# Patient Record
Sex: Female | Born: 1990 | Race: White | Hispanic: Yes | Marital: Single | Smoking: Current every day smoker
Health system: Southern US, Community
[De-identification: ages and names within clinical notes are randomized; demographics above are authoritative.]

## PROBLEM LIST (undated history)

## (undated) ENCOUNTER — Inpatient Hospital Stay: Payer: Self-pay

## (undated) DIAGNOSIS — E7800 Pure hypercholesterolemia, unspecified: Secondary | ICD-10-CM

## (undated) DIAGNOSIS — E119 Type 2 diabetes mellitus without complications: Secondary | ICD-10-CM

## (undated) HISTORY — PX: OTHER SURGICAL HISTORY: SHX169

---
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
## 2012-11-18 ENCOUNTER — Ambulatory Visit: Payer: Self-pay | Admitting: Primary Care

## 2012-12-24 ENCOUNTER — Ambulatory Visit: Payer: Self-pay | Admitting: Primary Care

## 2013-01-10 ENCOUNTER — Emergency Department: Payer: Self-pay | Admitting: Emergency Medicine

## 2013-10-09 ENCOUNTER — Emergency Department: Payer: Self-pay | Admitting: Emergency Medicine

## 2013-10-10 LAB — URINALYSIS, COMPLETE
BLOOD: NEGATIVE
Bilirubin,UR: NEGATIVE
Glucose,UR: NEGATIVE mg/dL (ref 0–75)
Ketone: NEGATIVE
Leukocyte Esterase: NEGATIVE
NITRITE: NEGATIVE
PROTEIN: NEGATIVE
Ph: 6 (ref 4.5–8.0)
Specific Gravity: 1.024 (ref 1.003–1.030)

## 2013-10-10 LAB — HCG, QUANTITATIVE, PREGNANCY: Beta Hcg, Quant.: 4389 m[IU]/mL — ABNORMAL HIGH

## 2013-11-10 ENCOUNTER — Encounter: Payer: Self-pay | Admitting: Obstetrics & Gynecology

## 2013-12-08 ENCOUNTER — Encounter: Payer: Self-pay | Admitting: Obstetrics & Gynecology

## 2013-12-28 ENCOUNTER — Observation Stay: Payer: Self-pay | Admitting: Obstetrics and Gynecology

## 2013-12-28 LAB — CBC WITH DIFFERENTIAL/PLATELET
Basophil #: 0.1 10*3/uL (ref 0.0–0.1)
Basophil %: 0.5 %
EOS PCT: 0.9 %
Eosinophil #: 0.1 10*3/uL (ref 0.0–0.7)
HCT: 35.5 % (ref 35.0–47.0)
HGB: 11.7 g/dL — ABNORMAL LOW (ref 12.0–16.0)
LYMPHS PCT: 17.6 %
Lymphocyte #: 1.6 10*3/uL (ref 1.0–3.6)
MCH: 29.9 pg (ref 26.0–34.0)
MCHC: 32.9 g/dL (ref 32.0–36.0)
MCV: 91 fL (ref 80–100)
MONO ABS: 0.5 x10 3/mm (ref 0.2–0.9)
MONOS PCT: 5.8 %
Neutrophil #: 6.9 10*3/uL — ABNORMAL HIGH (ref 1.4–6.5)
Neutrophil %: 75.2 %
PLATELETS: 237 10*3/uL (ref 150–440)
RBC: 3.9 10*6/uL (ref 3.80–5.20)
RDW: 12.8 % (ref 11.5–14.5)
WBC: 9.2 10*3/uL (ref 3.6–11.0)

## 2014-01-07 ENCOUNTER — Emergency Department: Payer: Self-pay | Admitting: Emergency Medicine

## 2014-01-08 ENCOUNTER — Emergency Department: Payer: Self-pay | Admitting: Emergency Medicine

## 2014-01-30 ENCOUNTER — Encounter: Payer: Self-pay | Admitting: Obstetrics and Gynecology

## 2014-02-27 ENCOUNTER — Observation Stay: Payer: Self-pay | Admitting: Obstetrics and Gynecology

## 2014-02-27 LAB — URINALYSIS, COMPLETE
Bacteria: NONE SEEN
Bilirubin,UR: NEGATIVE
Blood: NEGATIVE
GLUCOSE, UR: NEGATIVE mg/dL (ref 0–75)
Ketone: NEGATIVE
Leukocyte Esterase: NEGATIVE
Nitrite: NEGATIVE
Ph: 6 (ref 4.5–8.0)
Protein: NEGATIVE
RBC,UR: 3 /HPF (ref 0–5)
SPECIFIC GRAVITY: 1.024 (ref 1.003–1.030)
Squamous Epithelial: 7

## 2014-03-14 ENCOUNTER — Observation Stay: Payer: Self-pay

## 2014-03-28 ENCOUNTER — Ambulatory Visit: Payer: Self-pay | Admitting: Obstetrics and Gynecology

## 2014-03-28 LAB — CBC WITH DIFFERENTIAL/PLATELET
Basophil #: 0 10*3/uL (ref 0.0–0.1)
Basophil %: 0.3 %
EOS ABS: 0.1 10*3/uL (ref 0.0–0.7)
Eosinophil %: 0.7 %
HCT: 39.3 % (ref 35.0–47.0)
HGB: 13.5 g/dL (ref 12.0–16.0)
LYMPHS PCT: 16.9 %
Lymphocyte #: 1.4 10*3/uL (ref 1.0–3.6)
MCH: 30.9 pg (ref 26.0–34.0)
MCHC: 34.3 g/dL (ref 32.0–36.0)
MCV: 90 fL (ref 80–100)
Monocyte #: 0.6 x10 3/mm (ref 0.2–0.9)
Monocyte %: 7 %
NEUTROS PCT: 75.1 %
Neutrophil #: 6.4 10*3/uL (ref 1.4–6.5)
PLATELETS: 223 10*3/uL (ref 150–440)
RBC: 4.36 10*6/uL (ref 3.80–5.20)
RDW: 12.8 % (ref 11.5–14.5)
WBC: 8.6 10*3/uL (ref 3.6–11.0)

## 2014-03-29 ENCOUNTER — Inpatient Hospital Stay: Payer: Self-pay | Admitting: Obstetrics and Gynecology

## 2014-03-30 LAB — HEMATOCRIT: HCT: 31.9 % — ABNORMAL LOW (ref 35.0–47.0)

## 2014-03-30 LAB — GC/CHLAMYDIA PROBE AMP

## 2014-07-16 NOTE — Op Note (Signed)
PATIENT NAME:  Ashley HazardHERNANDEZ, Ashley M MR#:  [id number] DATE OF BIRTH:  Jul 07, 1990  DATE OF PROCEDURE:  03/29/2014  PREOPERATIVE DIAGNOSES:  1.  Elective repeat cesarean section.  2.  Intrauterine pregnancy at 239 + 4 weeks estimated gestational age.   POSTOPERATIVE DIAGNOSES:  1.  Elective repeat cesarean section.  2.  Intrauterine pregnancy at 139 + 4 weeks estimated gestational age.   PROCEDURE PERFORMED: Elective repeat low transverse cesarean section.   ANESTHESIA: Spinal.   SURGEON: Suzy Bouchardhomas J. Schermerhorn, MD   INDICATIONS: A 24 year old, gravida 3, para 2 patient with 2 prior cesarean sections has elected for repeat cesarean section. EDC of 04/01/2014, which places her at 2639 + 4 weeks' estimated gestational age.   FINDINGS:  1.  A vigorous female.  2.  Moderate scar tissue at the fascial layer and rectus muscle layer.   PROCEDURE IN DETAIL: After adequate spinal anesthesia, the patient was placed in the dorsal supine position with a hip roll under the right side. The patient had previously received 3 g IV Ancef prior to commencement of the case. The patient's abdomen was prepped and draped in normal sterile fashion. A Pfannenstiel incision was made 2 fingerbreadths above the symphysis pubis. Sharp dissection was used to identify the fascia. The fascia was opened in the midline and opened in a transverse fashion. The fascia was moderately thickened on the patient's right side, with disruption of the recti muscle, from previous surgeries.   The peritoneal cavity was opened sharply and the vesicouterine peritoneal fold was identified and was opened. A bladder flap was created. A low transverse uterine incision was made. Upon entry into the endometrial cavity, clear fluid resulted. The fetal head was brought to the incision and a vacuum was applied, and with 1 gentle pull of the vacuum, the fetal head was delivered. The vacuum was removed. A loose nuchal cord was reduced, and the shoulders and body  were delivered without difficulty. A vigorous female was passed to nursery staff, who assigned Apgar scores of 9 and 9 at one and five minutes. Weight 3410 g.   The placenta was manually delivered and the uterus was exteriorized. The endometrial cavity was wiped clean with a laparotomy tape, and the cervix was opened with a ring forceps. Intravenous Pitocin was administered while the uterine incision was closed with 1 chromic suture in a running locking fashion with good approximation of edges. Good hemostasis was noted. The fallopian tubes and ovaries appeared normal. The posterior cul-de-sac was irrigated and suctioned. The uterus was placed back into the abdominal cavity and the paracolic gutters were wiped clean with laparotomy tape. The uterine incision again appeared hemostatic. Interceed was placed on the uterine incision in a T-shaped fashion.   The fascia was then closed with 0 chromic suture in a running nonlocking fashion with good approximation of the edges. Subcutaneous tissues were irrigated and bovied. Given the depth of the subcutaneous tissue, approximately 4 cm, the dead space was closed with a 2-0 chromic suture in a running fashion. The skin was reapproximated with staples.   COMPLICATIONS: None.   ESTIMATED BLOOD LOSS: 600 mL   INTRAOPERATIVE FLUIDS: 1000 mL   DISPOSITION: The patient was taken to the recovery room in good condition.    ____________________________ Suzy Bouchardhomas J. Schermerhorn, MD tjs:MT D: 03/29/2014 08:39:04 ET T: 03/29/2014 13:23:54 ET JOB#: [id number]  cc: Suzy Bouchardhomas J. Schermerhorn, MD, <Dictator> Suzy BouchardHOMAS J SCHERMERHORN MD ELECTRONICALLY SIGNED 04/03/2014 9:22

## 2014-08-13 ENCOUNTER — Encounter: Payer: Self-pay | Admitting: Emergency Medicine

## 2014-08-13 ENCOUNTER — Emergency Department
Admission: EM | Admit: 2014-08-13 | Discharge: 2014-08-13 | Disposition: A | Discharge status: Home / Self Care | Payer: Medicaid Other | Attending: Obstetrics and Gynecology | Admitting: Obstetrics and Gynecology

## 2014-08-13 DIAGNOSIS — Z720 Tobacco use: Secondary | ICD-10-CM | POA: Diagnosis not present

## 2014-08-13 DIAGNOSIS — R109 Unspecified abdominal pain: Secondary | ICD-10-CM | POA: Insufficient documentation

## 2014-08-13 DIAGNOSIS — Z3202 Encounter for pregnancy test, result negative: Secondary | ICD-10-CM | POA: Diagnosis not present

## 2014-08-13 LAB — URINALYSIS COMPLETE WITH MICROSCOPIC (ARMC ONLY)
Bacteria, UA: NONE SEEN
Bilirubin Urine: NEGATIVE
Glucose, UA: NEGATIVE mg/dL
HGB URINE DIPSTICK: NEGATIVE
KETONES UR: NEGATIVE mg/dL
Leukocytes, UA: NEGATIVE
Nitrite: NEGATIVE
PH: 7 (ref 5.0–8.0)
PROTEIN: NEGATIVE mg/dL
Specific Gravity, Urine: 1.017 (ref 1.005–1.030)

## 2014-08-13 LAB — COMPREHENSIVE METABOLIC PANEL
ALT: 30 U/L (ref 14–54)
AST: 24 U/L (ref 15–41)
Albumin: 3.9 g/dL (ref 3.5–5.0)
Alkaline Phosphatase: 61 U/L (ref 38–126)
Anion gap: 8 (ref 5–15)
BUN: 12 mg/dL (ref 6–20)
CO2: 20 mmol/L — ABNORMAL LOW (ref 22–32)
Calcium: 8.9 mg/dL (ref 8.9–10.3)
Chloride: 110 mmol/L (ref 101–111)
Creatinine, Ser: 0.73 mg/dL (ref 0.44–1.00)
GFR calc Af Amer: >60 mL/min (ref >60)
GFR calc non Af Amer: >60 mL/min (ref >60)
Glucose, Bld: 85 mg/dL (ref 65–99)
Potassium: 3.9 mmol/L (ref 3.5–5.1)
Sodium: 138 mmol/L (ref 135–145)
Total Bilirubin: 0.4 mg/dL (ref 0.3–1.2)
Total Protein: 7.7 g/dL (ref 6.5–8.1)

## 2014-08-13 LAB — CBC WITH DIFFERENTIAL/PLATELET
BASOS ABS: 0 10*3/uL (ref 0–0.1)
Basophils Relative: 1 %
EOS PCT: 1 %
Eosinophils Absolute: 0.1 10*3/uL (ref 0–0.7)
HCT: 38.4 % (ref 35.0–47.0)
Hemoglobin: 12.8 g/dL (ref 12.0–16.0)
LYMPHS ABS: 1.4 10*3/uL (ref 1.0–3.6)
LYMPHS PCT: 20 %
MCH: 27.3 pg (ref 26.0–34.0)
MCHC: 33.2 g/dL (ref 32.0–36.0)
MCV: 82.1 fL (ref 80.0–100.0)
MONOS PCT: 8 %
Monocytes Absolute: 0.6 10*3/uL (ref 0.2–0.9)
NEUTROS PCT: 70 %
Neutro Abs: 4.7 10*3/uL (ref 1.4–6.5)
PLATELETS: 267 10*3/uL (ref 150–440)
RBC: 4.68 MIL/uL (ref 3.80–5.20)
RDW: 16 % — AB (ref 11.5–14.5)
WBC: 6.8 10*3/uL (ref 3.6–11.0)

## 2014-08-13 LAB — CHLAMYDIA/NGC RT PCR (ARMC ONLY)
Chlamydia Tr: NOT DETECTED
N gonorrhoeae: NOT DETECTED

## 2014-08-13 LAB — WET PREP, GENITAL
Clue Cells Wet Prep HPF POC: NONE SEEN
TRICH WET PREP: NONE SEEN
Yeast Wet Prep HPF POC: NONE SEEN

## 2014-08-13 LAB — LIPASE, BLOOD: Lipase: 33 U/L (ref 22–51)

## 2014-08-13 NOTE — ED Notes (Signed)
UA Preg negative 

## 2014-08-13 NOTE — ED Notes (Addendum)
Pt reports RLQ abd pain that radiates up to right shoulder since Friday. Denies fever.

## 2014-08-13 NOTE — Discharge Instructions (Signed)
Please seek medical attention for any high fevers, chest pain, shortness of breath, change in behavior, persistent vomiting, bloody stool or any other new or concerning symptoms. ° °Abdominal Pain, Women °Abdominal (stomach, pelvic, or belly) pain can be caused by many things. It is important to tell your doctor: °· The location of the pain. °· Does it come and go or is it present all the time? °· Are there things that start the pain (eating certain foods, exercise)? °· Are there other symptoms associated with the pain (fever, nausea, vomiting, diarrhea)? °All of this is helpful to know when trying to find the cause of the pain. °CAUSES  °· Stomach: virus or bacteria infection, or ulcer. °· Intestine: appendicitis (inflamed appendix), regional ileitis (Crohn's disease), ulcerative colitis (inflamed colon), irritable bowel syndrome, diverticulitis (inflamed diverticulum of the colon), or cancer of the stomach or intestine. °· Gallbladder disease or stones in the gallbladder. °· Kidney disease, kidney stones, or infection. °· Pancreas infection or cancer. °· Fibromyalgia (pain disorder). °· Diseases of the female organs: °¨ Uterus: fibroid (non-cancerous) tumors or infection. °¨ Fallopian tubes: infection or tubal pregnancy. °¨ Ovary: cysts or tumors. °¨ Pelvic adhesions (scar tissue). °¨ Endometriosis (uterus lining tissue growing in the pelvis and on the pelvic organs). °¨ Pelvic congestion syndrome (female organs filling up with blood just before the menstrual period). °¨ Pain with the menstrual period. °¨ Pain with ovulation (producing an egg). °¨ Pain with an IUD (intrauterine device, birth control) in the uterus. °¨ Cancer of the female organs. °· Functional pain (pain not caused by a disease, may improve without treatment). °· Psychological pain. °· Depression. °DIAGNOSIS  °Your doctor will decide the seriousness of your pain by doing an examination. °· Blood tests. °· X-rays. °· Ultrasound. °· CT scan  (computed tomography, special type of X-ray). °· MRI (magnetic resonance imaging). °· Cultures, for infection. °· Barium enema (dye inserted in the large intestine, to better view it with X-rays). °· Colonoscopy (looking in intestine with a lighted tube). °· Laparoscopy (minor surgery, looking in abdomen with a lighted tube). °· Major abdominal exploratory surgery (looking in abdomen with a large incision). °TREATMENT  °The treatment will depend on the cause of the pain.  °· Many cases can be observed and treated at home. °· Over-the-counter medicines recommended by your caregiver. °· Prescription medicine. °· Antibiotics, for infection. °· Birth control pills, for painful periods or for ovulation pain. °· Hormone treatment, for endometriosis. °· Nerve blocking injections. °· Physical therapy. °· Antidepressants. °· Counseling with a psychologist or psychiatrist. °· Minor or major surgery. °HOME CARE INSTRUCTIONS  °· Do not take laxatives, unless directed by your caregiver. °· Take over-the-counter pain medicine only if ordered by your caregiver. Do not take aspirin because it can cause an upset stomach or bleeding. °· Try a clear liquid diet (broth or water) as ordered by your caregiver. Slowly move to a bland diet, as tolerated, if the pain is related to the stomach or intestine. °· Have a thermometer and take your temperature several times a day, and record it. °· Bed rest and sleep, if it helps the pain. °· Avoid sexual intercourse, if it causes pain. °· Avoid stressful situations. °· Keep your follow-up appointments and tests, as your caregiver orders. °· If the pain does not go away with medicine or surgery, you may try: °¨ Acupuncture. °¨ Relaxation exercises (yoga, meditation). °¨ Group therapy. °¨ Counseling. °SEEK MEDICAL CARE IF:  °· You notice certain foods cause stomach   pain. °· Your home care treatment is not helping your pain. °· You need stronger pain medicine. °· You want your IUD removed. °· You  feel faint or lightheaded. °· You develop nausea and vomiting. °· You develop a rash. °· You are having side effects or an allergy to your medicine. °SEEK IMMEDIATE MEDICAL CARE IF:  °· Your pain does not go away or gets worse. °· You have a fever. °· Your pain is felt only in portions of the abdomen. The right side could possibly be appendicitis. The left lower portion of the abdomen could be colitis or diverticulitis. °· You are passing blood in your stools (bright red or black tarry stools, with or without vomiting). °· You have blood in your urine. °· You develop chills, with or without a fever. °· You pass out. °MAKE SURE YOU:  °· Understand these instructions. °· Will watch your condition. °· Will get help right away if you are not doing well or get worse. °Document Released: 12/29/2006 Document Revised: 07/18/2013 Document Reviewed: 01/18/2009 °[employer]® Patient Information ©2015 [employer]. This information is not intended to replace advice given to you by your health care provider. Make sure you discuss any questions you have with your health care provider. ° °

## 2014-08-13 NOTE — ED Notes (Signed)
D/c instructions reviewed w/ pt - pt denies any further questions or concerns at present.   

## 2014-08-13 NOTE — ED Provider Notes (Signed)
[facility] Emergency Department Provider Note   ____________________________________________  Time seen: 1405  I have reviewed the triage vital signs and the nursing notes.   HISTORY  Chief Complaint Abdominal Pain   History limited by: Not Limited   HPI Ashley Cooper is a 24 y.o. female who presents to the emergency department today with right lower quadrant abdominal pain for 2 days. Patient states the pain started when she was landed on her house. Since then it is been constant. It does radiate up into her right shoulder. She has had some decrease in appetite however denies any vomiting. Denies any change to the pain with eating.Denies any change in urination or defecation. Denies any fevers. Denies similar pain in the past. Denies any trauma.    History reviewed. No pertinent past medical history.  There are no active problems to display for this patient.   Past Surgical History  Procedure Laterality Date  . Csection x3      No current outpatient prescriptions on file.  Allergies Review of patient's allergies indicates no known allergies.  No family history on file.  Social History History  Substance Use Topics  . Smoking status: Current Some Day Smoker -- 2.00 packs/day    Types: Cigarettes  . Smokeless tobacco: Not on file  . Alcohol Use: Yes    Review of Systems  Constitutional: Negative for fever. Cardiovascular: Negative for chest pain. Respiratory: Negative for shortness of breath. Gastrointestinal: positive for abdominal pain. Genitourinary: Negative for dysuria. Musculoskeletal: Negative for back pain. Skin: Negative for rash. Neurological: Negative for headaches, focal weakness or numbness.  10-point ROS otherwise negative.  ____________________________________________   PHYSICAL EXAM:  VITAL SIGNS: ED Triage Vitals  Enc Vitals Group     BP 08/13/14 1300 122/63 mmHg     Pulse Rate 08/13/14 1300 77    Resp 08/13/14 1300 20     Temp 08/13/14 1300 98.3 F (36.8 C)     Temp Source 08/13/14 1300 Oral     SpO2 08/13/14 1300 100 %     Weight 08/13/14 1300 230 lb (104.327 kg)     Height 08/13/14 1300 5\' 11"  (1.803 m)     Head Cir --      Peak Flow --      Pain Score 08/13/14 1303 9   Constitutional: Alert and oriented. Well appearing and in no distress. Eyes: Conjunctivae are normal. PERRL. Normal extraocular movements. ENT   Head: Normocephalic and atraumatic.   Nose: No congestion/rhinnorhea.   Mouth/Throat: Mucous membranes are moist.   Neck: No stridor. Hematological/Lymphatic/Immunilogical: No cervical lymphadenopathy. Cardiovascular: Normal rate, regular rhythm.  No murmurs, rubs, or gallops. Respiratory: Normal respiratory effort without tachypnea nor retractions. Breath sounds are clear and equal bilaterally. No wheezes/rales/rhonchi. Gastrointestinal: diffusely minimally tender to palpation. Soft. No rebound. No guarding. Genitourinary: No external lesions. No blood in the vaginal vault. No CMT. No adnexal tenderness or fullness. Musculoskeletal: Normal range of motion in all extremities. No joint effusions.  No lower extremity tenderness nor edema. Neurologic:  Normal speech and language. No gross focal neurologic deficits are appreciated. Speech is normal.  Skin:  Skin is warm, dry and intact. No rash noted. Psychiatric: Mood and affect are normal. Speech and behavior are normal. Patient exhibits appropriate insight and judgment.  ____________________________________________    LABS (pertinent positives/negatives)  Labs Reviewed  WET PREP, GENITAL - Abnormal; Notable for the following:    WBC, Wet Prep HPF POC MODERATE (*)  All other components within normal limits  COMPREHENSIVE METABOLIC PANEL - Abnormal; Notable for the following:    CO2 20 (*)    All other components within normal limits  CBC WITH DIFFERENTIAL/PLATELET - Abnormal; Notable for the  following:    RDW 16.0 (*)    All other components within normal limits  URINALYSIS COMPLETEWITH MICROSCOPIC ([facility] ONLY) - Abnormal; Notable for the following:    Color, Urine YELLOW (*)    APPearance CLEAR (*)    Squamous Epithelial / LPF 0-5 (*)    All other components within normal limits  CHLAMYDIA/NGC RT PCR ([facility] ONLY)  LIPASE, BLOOD  POC URINE PREG, ED     ____________________________________________   EKG  None  ____________________________________________    RADIOLOGY  None  ____________________________________________   PROCEDURES  Procedure(s) performed: None  Critical Care performed: No  ____________________________________________   INITIAL IMPRESSION / ASSESSMENT AND PLAN / ED COURSE  Pertinent labs & imaging results that were available during my care of the patient were reviewed by me and considered in my medical decision making (see chart for details).  She here with right lower quadrant pain. Pelvic exam without any concerning findings. Additionally patient without fever or leukocytosis that i doubt appendicitis. An for patient to be discharge if labwork remains negative. Awaiting LFTs and lipase at time of sign out.  ____________________________________________   FINAL CLINICAL IMPRESSION(S) / ED DIAGNOSES  Final diagnoses:  Abdominal pain, unspecified abdominal location     Phineas Semen, MD 08/14/14 1347

## 2014-08-13 NOTE — ED Provider Notes (Signed)
-----------------------------------------   3:00 AM on 08/13/2014 -----------------------------------------  Assuming care from Dr. Derrill KayGoodman.  In short, Ashley Cooper is a 24 y.o. female with a chief complaint of Abdominal Pain .  Refer to the original H&P for additional details.  The current plan of care is to follow-up LFTs and discharge if normal.   (Note that documentation was delayed due to multiple ED patients requiring immediate care.)   The patient's lab results are negative.  I put her up for discharge for managing two emergent patients so that she would not have to wait longer.  The nurse discussed her discharge papers with her.    Ashley Roseory Modean Mccullum, MD 08/14/14 [phone]

## 2014-08-15 LAB — POCT PREGNANCY, URINE: PREG TEST UR: NEGATIVE

## 2015-07-25 ENCOUNTER — Emergency Department: Payer: Medicaid Other

## 2015-07-25 ENCOUNTER — Emergency Department
Admission: EM | Admit: 2015-07-25 | Discharge: 2015-07-25 | Disposition: A | Discharge status: Home / Self Care | Payer: Medicaid Other | Attending: Emergency Medicine | Admitting: Emergency Medicine

## 2015-07-25 DIAGNOSIS — Z3A10 10 weeks gestation of pregnancy: Secondary | ICD-10-CM | POA: Insufficient documentation

## 2015-07-25 DIAGNOSIS — F17210 Nicotine dependence, cigarettes, uncomplicated: Secondary | ICD-10-CM | POA: Diagnosis not present

## 2015-07-25 DIAGNOSIS — R109 Unspecified abdominal pain: Secondary | ICD-10-CM

## 2015-07-25 DIAGNOSIS — O26891 Other specified pregnancy related conditions, first trimester: Secondary | ICD-10-CM | POA: Diagnosis present

## 2015-07-25 DIAGNOSIS — R1030 Lower abdominal pain, unspecified: Secondary | ICD-10-CM | POA: Diagnosis not present

## 2015-07-25 DIAGNOSIS — O26899 Other specified pregnancy related conditions, unspecified trimester: Secondary | ICD-10-CM

## 2015-07-25 DIAGNOSIS — R102 Pelvic and perineal pain: Secondary | ICD-10-CM | POA: Insufficient documentation

## 2015-07-25 DIAGNOSIS — R300 Dysuria: Secondary | ICD-10-CM | POA: Diagnosis not present

## 2015-07-25 LAB — CBC
HEMATOCRIT: 39.2 % (ref 35.0–47.0)
Hemoglobin: 13.1 g/dL (ref 12.0–16.0)
MCH: 27.7 pg (ref 26.0–34.0)
MCHC: 33.5 g/dL (ref 32.0–36.0)
MCV: 82.8 fL (ref 80.0–100.0)
Platelets: 289 10*3/uL (ref 150–440)
RBC: 4.74 MIL/uL (ref 3.80–5.20)
RDW: 15.2 % — AB (ref 11.5–14.5)
WBC: 11.4 10*3/uL — ABNORMAL HIGH (ref 3.6–11.0)

## 2015-07-25 LAB — BASIC METABOLIC PANEL
Anion gap: 8 (ref 5–15)
BUN: 9 mg/dL (ref 6–20)
CO2: 22 mmol/L (ref 22–32)
Calcium: 9 mg/dL (ref 8.9–10.3)
Chloride: 107 mmol/L (ref 101–111)
Creatinine, Ser: 0.68 mg/dL (ref 0.44–1.00)
GFR calc non Af Amer: >60 mL/min (ref >60)
GLUCOSE: 94 mg/dL (ref 65–99)
POTASSIUM: 3.6 mmol/L (ref 3.5–5.1)
Sodium: 137 mmol/L (ref 135–145)

## 2015-07-25 LAB — URINALYSIS COMPLETE WITH MICROSCOPIC (ARMC ONLY)
Bacteria, UA: NONE SEEN
Bilirubin Urine: NEGATIVE
Glucose, UA: NEGATIVE mg/dL
KETONES UR: NEGATIVE mg/dL
LEUKOCYTES UA: NEGATIVE
NITRITE: NEGATIVE
Protein, ur: NEGATIVE mg/dL
SPECIFIC GRAVITY, URINE: 1.024 (ref 1.005–1.030)
pH: 6 (ref 5.0–8.0)

## 2015-07-25 LAB — HCG, QUANTITATIVE, PREGNANCY: hCG, Beta Chain, Quant, S: 48499 m[IU]/mL — ABNORMAL HIGH (ref <5)

## 2015-07-25 LAB — POCT PREGNANCY, URINE: Preg Test, Ur: POSITIVE — AB

## 2015-07-25 MED ORDER — ACETAMINOPHEN 500 MG PO TABS
ORAL_TABLET | ORAL | Status: AC
  Filled 2015-07-25: qty 2

## 2015-07-25 MED ORDER — ACETAMINOPHEN 500 MG PO TABS
1000.0000 mg | ORAL_TABLET | Freq: Once | ORAL | Status: AC
  Administered 2015-07-25: 1000 mg via ORAL

## 2015-07-25 NOTE — ED Provider Notes (Signed)
[facility] Emergency Department Provider Note        Time seen: ----------------------------------------- 9:48 PM on 07/25/2015 -----------------------------------------    I have reviewed the triage vital signs and the nursing notes.   HISTORY  Chief Complaint Abdominal Pain    HPI Ashley Cooper is a 25 y.o. female who presents ER for lower abdominal pain for last 3 weeks. Patient states pain is sharp, she is [redacted] weeks pregnant. She denies any previous Occasions with pregnancies. She denies any vaginal bleeding but states she's had a little vaginal discharge. She does have some pain with urination.Patient states she's had this lower abdominal pain in the past and was told it may be from an ovarian cyst.   No past medical history on file.  There are no active problems to display for this patient.   Past Surgical History  Procedure Laterality Date  . Csection x3      Allergies Review of patient's allergies indicates no known allergies.  Social History Social History  Substance Use Topics  . Smoking status: Current Some Day Smoker -- 2.00 packs/day    Types: Cigarettes  . Smokeless tobacco: Not on file  . Alcohol Use: Yes    Review of Systems Constitutional: Negative for fever. Eyes: Negative for visual changes. ENT: Negative for sore throat. Cardiovascular: Negative for chest pain. Respiratory: Negative for shortness of breath. Gastrointestinal: Positive for abdominal pain Genitourinary: Poss for dysuria Musculoskeletal: Negative for back pain. Skin: Negative for rash. Neurological: Negative for headaches, focal weakness or numbness.  10-point ROS otherwise negative.  ____________________________________________   PHYSICAL EXAM:  VITAL SIGNS: ED Triage Vitals  Enc Vitals Group     BP 07/25/15 2026 133/63 mmHg     Pulse Rate 07/25/15 2026 91     Resp 07/25/15 2026 18     Temp 07/25/15 2026 98.4 F (36.9 C)     Temp  Source 07/25/15 2026 Oral     SpO2 07/25/15 2026 99 %     Weight 07/25/15 2026 280 lb (127.007 kg)     Height 07/25/15 2026 5\' 11"  (1.803 m)     Head Cir --      Peak Flow --      Pain Score 07/25/15 2030 8     Pain Loc --      Pain Edu? --      Excl. in GC? --     Constitutional: Alert and oriented. Well appearing and in no distress. Eyes: Conjunctivae are normal. PERRL. Normal extraocular movements. ENT   Head: Normocephalic and atraumatic.   Nose: No congestion/rhinnorhea.   Mouth/Throat: Mucous membranes are moist.   Neck: No stridor. Cardiovascular: Normal rate, regular rhythm. No murmurs, rubs, or gallops. Respiratory: Normal respiratory effort without tachypnea nor retractions. Breath sounds are clear and equal bilaterally. No wheezes/rales/rhonchi. Gastrointestinal: Soft and nontender. Normal bowel sounds Musculoskeletal: Nontender with normal range of motion in all extremities. No lower extremity tenderness nor edema. Neurologic:  Normal speech and language. No gross focal neurologic deficits are appreciated.  Skin:  Skin is warm, dry and intact. No rash noted. Psychiatric: Mood and affect are normal. Speech and behavior are normal.   ____________________________________________  ED COURSE:  Pertinent labs & imaging results that were available during my care of the patient were reviewed by me and considered in my medical decision making (see chart for details). Patient is in no acute distress, will check basic labs and likely ultrasound imaging of the abdomen. ____________________________________________  LABS (pertinent positives/negatives)  Labs Reviewed  CBC - Abnormal; Notable for the following:    WBC 11.4 (*)    RDW 15.2 (*)    All other components within normal limits  URINALYSIS COMPLETEWITH MICROSCOPIC ([facility] ONLY) - Abnormal; Notable for the following:    Color, Urine YELLOW (*)    APPearance HAZY (*)    Hgb urine dipstick 1+ (*)     Squamous Epithelial / LPF 6-30 (*)    All other components within normal limits  POCT PREGNANCY, URINE - Abnormal; Notable for the following:    Preg Test, Ur POSITIVE (*)    All other components within normal limits  BASIC METABOLIC PANEL  HCG, QUANTITATIVE, PREGNANCY  POC URINE PREG, ED    RADIOLOGY Pregnancy ultrasound Is pending at this time  ____________________________________________  FINAL ASSESSMENT AND PLAN  Abdominal pain in pregnancy  Plan: Patient with labs as dictated above. Pregnancy ultrasound is pending at this time.   Emily Filbert, MD   Note: This dictation was prepared with Dragon dictation. Any transcriptional errors that result from this process are unintentional   Emily Filbert, MD 07/25/15 2249

## 2015-07-25 NOTE — ED Notes (Signed)
Pt transferred to US via stretcher.

## 2015-07-25 NOTE — ED Notes (Addendum)
Pt states lower abdominal pain x3 weeks, states sharp. Pt states [redacted] weeks along in pregnancy she thinks, 4th pregnancy, denies previous complications with pregnancies, seeing Phineas Realharles Drew for prenatal care. Denies vaginal bleeding, states a little vaginal discharge. States some pain with urination. Denies urinary frequency. States R side pain as well.

## 2015-07-25 NOTE — ED Notes (Signed)
Pt transferred to [facility] by ambulation. Pt informed she is waiting for US results. Pt verbalized understanding. Pt will remain in care of this RN.

## 2015-07-25 NOTE — ED Notes (Signed)
Pt is [redacted] weeks pregnant and is co lower abd pain for few weeks states no vag bleeding does have pain on urination.

## 2015-07-25 NOTE — Discharge Instructions (Signed)

## 2015-08-07 ENCOUNTER — Encounter: Payer: Self-pay | Admitting: Emergency Medicine

## 2015-08-07 ENCOUNTER — Emergency Department
Admission: EM | Admit: 2015-08-07 | Discharge: 2015-08-07 | Disposition: A | Discharge status: Home / Self Care | Payer: No Typology Code available for payment source | Attending: Emergency Medicine | Admitting: Emergency Medicine

## 2015-08-07 ENCOUNTER — Emergency Department: Payer: No Typology Code available for payment source

## 2015-08-07 DIAGNOSIS — O26891 Other specified pregnancy related conditions, first trimester: Secondary | ICD-10-CM | POA: Diagnosis not present

## 2015-08-07 DIAGNOSIS — F17210 Nicotine dependence, cigarettes, uncomplicated: Secondary | ICD-10-CM | POA: Diagnosis not present

## 2015-08-07 DIAGNOSIS — Y92410 Unspecified street and highway as the place of occurrence of the external cause: Secondary | ICD-10-CM | POA: Diagnosis not present

## 2015-08-07 DIAGNOSIS — R1012 Left upper quadrant pain: Secondary | ICD-10-CM

## 2015-08-07 DIAGNOSIS — Y999 Unspecified external cause status: Secondary | ICD-10-CM | POA: Insufficient documentation

## 2015-08-07 DIAGNOSIS — Z3A12 12 weeks gestation of pregnancy: Secondary | ICD-10-CM | POA: Diagnosis not present

## 2015-08-07 DIAGNOSIS — R1030 Lower abdominal pain, unspecified: Secondary | ICD-10-CM | POA: Diagnosis not present

## 2015-08-07 DIAGNOSIS — R110 Nausea: Secondary | ICD-10-CM | POA: Diagnosis present

## 2015-08-07 DIAGNOSIS — Y9389 Activity, other specified: Secondary | ICD-10-CM | POA: Diagnosis not present

## 2015-08-07 LAB — CBC
HEMATOCRIT: 38.5 % (ref 35.0–47.0)
HEMOGLOBIN: 12.9 g/dL (ref 12.0–16.0)
MCH: 28.6 pg (ref 26.0–34.0)
MCHC: 33.6 g/dL (ref 32.0–36.0)
MCV: 85.2 fL (ref 80.0–100.0)
PLATELETS: 264 10*3/uL (ref 150–440)
RBC: 4.53 MIL/uL (ref 3.80–5.20)
RDW: 15.1 % — ABNORMAL HIGH (ref 11.5–14.5)
WBC: 8.9 10*3/uL (ref 3.6–11.0)

## 2015-08-07 LAB — COMPREHENSIVE METABOLIC PANEL
ALT: 13 U/L — ABNORMAL LOW (ref 14–54)
ANION GAP: 9 (ref 5–15)
AST: 15 U/L (ref 15–41)
Albumin: 3.5 g/dL (ref 3.5–5.0)
Alkaline Phosphatase: 53 U/L (ref 38–126)
BUN: 8 mg/dL (ref 6–20)
CHLORIDE: 107 mmol/L (ref 101–111)
CO2: 22 mmol/L (ref 22–32)
Calcium: 8.9 mg/dL (ref 8.9–10.3)
Creatinine, Ser: 0.6 mg/dL (ref 0.44–1.00)
GFR calc Af Amer: >60 mL/min (ref >60)
Glucose, Bld: 92 mg/dL (ref 65–99)
POTASSIUM: 3.4 mmol/L — AB (ref 3.5–5.1)
Sodium: 138 mmol/L (ref 135–145)
Total Bilirubin: 0.2 mg/dL — ABNORMAL LOW (ref 0.3–1.2)
Total Protein: 7.2 g/dL (ref 6.5–8.1)

## 2015-08-07 LAB — HCG, QUANTITATIVE, PREGNANCY: HCG, BETA CHAIN, QUANT, S: 38257 m[IU]/mL — AB (ref <5)

## 2015-08-07 LAB — LIPASE, BLOOD: LIPASE: 18 U/L (ref 11–51)

## 2015-08-07 MED ORDER — OXYCODONE-ACETAMINOPHEN 5-325 MG PO TABS
1.0000 | ORAL_TABLET | Freq: Four times a day (QID) | ORAL | Status: DC | PRN
Start: 2015-08-07 — End: 2015-09-13

## 2015-08-07 MED ORDER — ONDANSETRON 4 MG PO TBDP
4.0000 mg | ORAL_TABLET | Freq: Once | ORAL | Status: DC
  Filled 2015-08-07: qty 9

## 2015-08-07 MED ORDER — OXYCODONE-ACETAMINOPHEN 5-325 MG PO TABS
2.0000 | ORAL_TABLET | Freq: Once | ORAL | Status: AC
  Administered 2015-08-07: 2 via ORAL
  Filled 2015-08-07: qty 2

## 2015-08-07 NOTE — ED Notes (Signed)
Patient transported to Ultrasound 

## 2015-08-07 NOTE — Discharge Instructions (Signed)
You may take Tylenol for mild to moderate pain and Percocet for severe pain.  Do not drive within 8 hours of taking Percocet.  Return to the emergency department for severe pain, lightheadedness or fainting, vaginal bleeding, or any other symptoms concerning to you.
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
## 2015-08-08 LAB — OB RESULTS CONSOLE GC/CHLAMYDIA
CHLAMYDIA, DNA PROBE: NEGATIVE
GC PROBE AMP, GENITAL: NEGATIVE

## 2015-08-08 LAB — OB RESULTS CONSOLE HIV ANTIBODY (ROUTINE TESTING): HIV: NONREACTIVE

## 2015-08-08 LAB — OB RESULTS CONSOLE RPR: RPR: NONREACTIVE

## 2015-08-08 LAB — OB RESULTS CONSOLE ABO/RH: RH Type: POSITIVE

## 2015-08-08 LAB — OB RESULTS CONSOLE HEPATITIS B SURFACE ANTIGEN: Hepatitis B Surface Ag: NEGATIVE

## 2015-08-09 LAB — OB RESULTS CONSOLE VARICELLA ZOSTER ANTIBODY, IGG: Varicella: IMMUNE

## 2015-08-09 LAB — OB RESULTS CONSOLE RUBELLA ANTIBODY, IGM: Rubella: IMMUNE

## 2015-08-16 ENCOUNTER — Other Ambulatory Visit: Payer: Self-pay | Admitting: Primary Care

## 2015-08-16 DIAGNOSIS — N809 Endometriosis, unspecified: Secondary | ICD-10-CM

## 2015-09-13 ENCOUNTER — Ambulatory Visit (HOSPITAL_BASED_OUTPATIENT_CLINIC_OR_DEPARTMENT_OTHER)
Admission: RE | Admit: 2015-09-13 | Discharge: 2015-09-13 | Disposition: A | Discharge status: Home / Self Care | Payer: Medicaid Other | Source: Ambulatory Visit | Attending: Primary Care | Admitting: Primary Care

## 2015-09-13 ENCOUNTER — Ambulatory Visit
Admission: RE | Admit: 2015-09-13 | Discharge: 2015-09-13 | Disposition: A | Discharge status: Home / Self Care | Payer: Medicaid Other | Source: Ambulatory Visit | Attending: Obstetrics and Gynecology | Admitting: Obstetrics and Gynecology

## 2015-09-13 DIAGNOSIS — O99212 Obesity complicating pregnancy, second trimester: Secondary | ICD-10-CM | POA: Insufficient documentation

## 2015-09-13 DIAGNOSIS — Z3A18 18 weeks gestation of pregnancy: Secondary | ICD-10-CM | POA: Diagnosis not present

## 2015-09-13 DIAGNOSIS — Z789 Other specified health status: Secondary | ICD-10-CM | POA: Diagnosis not present

## 2015-09-13 DIAGNOSIS — Z3482 Encounter for supervision of other normal pregnancy, second trimester: Secondary | ICD-10-CM | POA: Diagnosis present

## 2015-09-13 DIAGNOSIS — Z331 Pregnant state, incidental: Secondary | ICD-10-CM | POA: Diagnosis not present

## 2015-09-13 DIAGNOSIS — O34219 Maternal care for unspecified type scar from previous cesarean delivery: Secondary | ICD-10-CM | POA: Diagnosis not present

## 2015-09-13 DIAGNOSIS — E669 Obesity, unspecified: Secondary | ICD-10-CM

## 2015-09-13 DIAGNOSIS — N809 Endometriosis, unspecified: Secondary | ICD-10-CM | POA: Insufficient documentation

## 2015-09-13 DIAGNOSIS — Z87898 Personal history of other specified conditions: Secondary | ICD-10-CM | POA: Diagnosis not present

## 2015-09-13 DIAGNOSIS — Z302 Encounter for sterilization: Secondary | ICD-10-CM

## 2015-09-13 NOTE — Addendum Note (Signed)
Encounter addended by: Jimmey RalphElizabeth Peyten Weare, MD on: 09/13/2015  1:21 PM<BR>     Documentation filed: Problem List

## 2015-09-13 NOTE — Progress Notes (Signed)
[facility] Consultation   Chief Complaint: multiple cesareans, elevated BMI 43  and question of endometriosis  HPI: [name] is a 25 y.o. G5 P3013 at 17 w 2d by earliest scan at 10w 1d (LMP not regular)   who presents in consultation from Phineas Realharles Drew  for multiple cesareans , obesity and a question of endometriosis complicating pregnancy . Here with her partner Alysia Pennarvin who works as a Financial risk analystcook.  Second 6yo child is special needs [facility]. Pt says Ashley Cooper was healthy at birth, He got a virus at 423 months of age and [facility] - initially sent home with trach and G tube . He is speaking better and attends special ed classes. She says she doubts he will be able to read.  Pt says she has always been heavy and has gained weight with each baby though with the stress of Ashley's illness lost down to 220. Dismayed by her weight gain since the last baby.  Past Medical History: Patient  has no past medical history on file.  Past Surgical History: She  has past surgical history that includes Csection x3.  Obstetric History:  Para 1 3/ [facility] - female 7+ lbs  Para 2  02/2009 term ERLTCS female 6+ lbs  Para 3 2014 TAB Para 4 03/2014 ERLTCS [facility] - female 7 lbs - moderate adhesions noted  Gynecologic History:  Patient's last menstrual period was 05/07/2015 (exact date).    Medications: She @[facility] @  Allergies: Patient has No Known Allergies.  Social History: Patient  reports that she has been smoking Cigarettes.  She has been smoking about 2.00 packs per day. She does not have any smokeless tobacco history on file. She reports that she drinks alcohol. She reports that she does not use illicit drugs.  Family History: family history is not on file.  Review of Systems A full 12 point review of systems was negative or as noted in the History of Present Illness.  Physical Exam: LMP 05/07/2015 (Exact Date) See u/s     Asessement: 1. History of cesarean delivery affecting pregnancy   2. Endometriosis   3. Obesity   4. Desires sterilization, I reviewed alternative contraception - continuous OCPs or mirena may be medically indicated due to h/o endometriosis  5. Special needs child ?   Plan: 1 agree with ERLTCS at 39 weeks - we do not encourage VBAC after multiple cesareans - last procedure described moderate scar tissue - today there appears to be a clear interface between myometrium and placenta and anterior placenta is above LUS  2 Obesity BMI over 40 - pt encouraged to limit weight gain to 10-15 lbs Consider baseline TSH, CMP and early glucola  3 endometriosis - pregnancy should be helpful for endometriosis. pain may be related to prior surgeries ? 4 pt is young high risk of tubal regret and failure - i reviewed that hormonal Rx may benefit endometriosis - pt doesn't like medicine and thinks BTL is right for her.   Total time spent with the patient was 30 minutes with greater than 50% spent in counseling and coordination of care. We appreciate this interesting consult and will be happy to be involved in the ongoing care of [name] in anyway her obstetricians desire. Jimmey RalphLivingston, Rahaf Carbonell, MD Maternal-Fetal Medicine [facility]

## 2015-10-10 ENCOUNTER — Other Ambulatory Visit: Payer: Self-pay

## 2015-10-10 DIAGNOSIS — O99212 Obesity complicating pregnancy, second trimester: Secondary | ICD-10-CM

## 2015-10-11 ENCOUNTER — Ambulatory Visit
Admission: RE | Admit: 2015-10-11 | Discharge: 2015-10-11 | Disposition: A | Discharge status: Home / Self Care | Payer: Medicaid Other | Source: Ambulatory Visit | Attending: Maternal & Fetal Medicine | Admitting: Maternal & Fetal Medicine

## 2015-10-11 DIAGNOSIS — O99212 Obesity complicating pregnancy, second trimester: Secondary | ICD-10-CM | POA: Insufficient documentation

## 2015-10-11 DIAGNOSIS — Z3A21 21 weeks gestation of pregnancy: Secondary | ICD-10-CM | POA: Diagnosis not present

## 2015-10-11 DIAGNOSIS — E669 Obesity, unspecified: Secondary | ICD-10-CM | POA: Insufficient documentation

## 2015-10-11 DIAGNOSIS — O321XX0 Maternal care for breech presentation, not applicable or unspecified: Secondary | ICD-10-CM | POA: Diagnosis not present

## 2015-11-26 ENCOUNTER — Other Ambulatory Visit: Payer: Self-pay

## 2015-11-26 DIAGNOSIS — O99213 Obesity complicating pregnancy, third trimester: Secondary | ICD-10-CM

## 2015-11-29 ENCOUNTER — Ambulatory Visit: Payer: Medicaid Other

## 2015-11-30 ENCOUNTER — Observation Stay
Admission: EM | Admit: 2015-11-30 | Discharge: 2015-12-01 | Disposition: A | Discharge status: Home / Self Care | Payer: Medicaid Other | Attending: Obstetrics & Gynecology | Admitting: Obstetrics & Gynecology

## 2015-11-30 ENCOUNTER — Encounter: Payer: Self-pay | Admitting: *Deleted

## 2015-11-30 DIAGNOSIS — Z87891 Personal history of nicotine dependence: Secondary | ICD-10-CM | POA: Insufficient documentation

## 2015-11-30 DIAGNOSIS — O26893 Other specified pregnancy related conditions, third trimester: Principal | ICD-10-CM | POA: Insufficient documentation

## 2015-11-30 DIAGNOSIS — Z3A28 28 weeks gestation of pregnancy: Secondary | ICD-10-CM | POA: Insufficient documentation

## 2015-11-30 DIAGNOSIS — M549 Dorsalgia, unspecified: Secondary | ICD-10-CM | POA: Diagnosis present

## 2015-12-01 DIAGNOSIS — Z87891 Personal history of nicotine dependence: Secondary | ICD-10-CM | POA: Diagnosis not present

## 2015-12-01 DIAGNOSIS — O26893 Other specified pregnancy related conditions, third trimester: Secondary | ICD-10-CM | POA: Diagnosis not present

## 2015-12-01 DIAGNOSIS — Z3A28 28 weeks gestation of pregnancy: Secondary | ICD-10-CM | POA: Diagnosis not present

## 2015-12-01 DIAGNOSIS — M549 Dorsalgia, unspecified: Secondary | ICD-10-CM | POA: Diagnosis not present

## 2015-12-01 LAB — URINALYSIS COMPLETE WITH MICROSCOPIC (ARMC ONLY)
Bilirubin Urine: NEGATIVE
Glucose, UA: NEGATIVE mg/dL
HGB URINE DIPSTICK: NEGATIVE
Ketones, ur: NEGATIVE mg/dL
NITRITE: NEGATIVE
PH: 7 (ref 5.0–8.0)
PROTEIN: NEGATIVE mg/dL
SPECIFIC GRAVITY, URINE: 1.011 (ref 1.005–1.030)

## 2015-12-01 MED ORDER — ACETAMINOPHEN 325 MG PO TABS
650.0000 mg | ORAL_TABLET | ORAL | Status: DC | PRN
  Administered 2015-12-01: 650 mg via ORAL
  Filled 2015-12-01: qty 2

## 2015-12-01 NOTE — OB Triage Note (Signed)
Discharge instructions reviewed with patient who verbalized understanding.

## 2015-12-01 NOTE — OB Triage Note (Signed)
Patient presented to L&D stating her back began hurting suddenly when she tried to get up off the couch at home.  Patient denies any leaking of fluid, vaginal bleeding or decreased fetal movement.  Patient denies feeling any contractions, states the pain is constant in her back.

## 2015-12-01 NOTE — Discharge Summary (Signed)
Ashley Cooper is a 25 y.o. female. She is at 5134w4d gestation.  Chief Complaint:  sudden onset back oain S: Resting comfortably. no CTX, no VB.no LOF,  Active fetal movement.   Location:low back Context: patient was resting on her couch, tried to get up and felt severe back pain Onset/timing/duration: acute, about 1-2 hrs ago, constant Quality: aching Severity: severe  Aggravating factors: movement Alleviating factors: rest Associated signs/symptoms: denies dysuria, previous injury, pain radiating down legs or up back, able to stand/sit, no one-sided weakness   Maternal Medical History:  History reviewed. No pertinent past medical history.  Past Surgical History:  Procedure Laterality Date  . CESAREAN SECTION    . Csection x3      No Known Allergies  Prior to Admission medications   Medication Sig Start Date End Date Taking? Authorizing Provider  Prenatal Vit-Fe Fumarate-FA (PRENATAL MULTIVITAMIN) TABS tablet Take 1 tablet by mouth daily at 12 noon.    Historical Provider, MD     [facility]   Social History: She  reports that she has quit smoking. Her smoking use included Cigarettes. She smoked 2.00 packs per day. She has never used smokeless tobacco. She reports that she does not drink alcohol or use drugs.  Family History: family history is not on file.   Review of Systems: A full review of systems was performed and negative except as noted in the HPI.     O:  BP 131/72 (BP Location: Left Arm)   Pulse 99   Temp 98.4 F (36.9 C) (Oral)   Resp 20   LMP 05/07/2015 (Exact Date)  Results for orders placed or performed during the hospital encounter of 11/30/15 (from the past 48 hour(s))  Urinalysis complete, with microscopic [facility] only)   Collection Time: 12/01/15 12:40 AM  Result Value Ref Range   Color, Urine YELLOW (A) YELLOW   APPearance HAZY (A) CLEAR   Glucose, UA NEGATIVE NEGATIVE mg/dL   Bilirubin Urine NEGATIVE NEGATIVE   Ketones, ur NEGATIVE NEGATIVE mg/dL   Specific Gravity, Urine 1.011 1.005 - 1.030   Hgb urine dipstick NEGATIVE NEGATIVE   pH 7.0 5.0 - 8.0   Protein, ur NEGATIVE NEGATIVE mg/dL   Nitrite NEGATIVE NEGATIVE   Leukocytes, UA TRACE (A) NEGATIVE   RBC / HPF 0-5 0 - 5 RBC/hpf   WBC, UA 6-30 0 - 5 WBC/hpf   Bacteria, UA RARE (A) NONE SEEN   Squamous Epithelial / LPF 0-5 (A) NONE SEEN   Mucous PRESENT       Constitutional: NAD, AAOx3  HE/ENT: extraocular movements grossly intact, moist mucous membranes CV: RRR PULM: nl respiratory effort, CTABL     Abd: gravid, non-tender, non-distended, soft      Ext: Non-tender, Nonedmeatous  Back: no point tenderness, vertebral tenderness, ecchymoses, CVA tenderness  Psych: mood appropriate, speech normal Pelvic: non-tender bladder/cervix, closed/thick/high  FHT: 145, mod + accels no decels TOCO: quiet    A/P:  24yo G4P3003 @ 28 weeks with back pain.   Labor: not present.   Fetal Wellbeing: Reassuring Cat 1 tracing.  Given ice pack to low back and tylenol.  After this intervention, her pain is improved.    D/c home stable, precautions reviewed, follow-up as scheduled.   ----- [name], MD Attending Obstetrician and Gynecologist [facility]

## 2015-12-03 ENCOUNTER — Ambulatory Visit: Payer: Medicaid Other

## 2015-12-03 ENCOUNTER — Ambulatory Visit
Admission: RE | Admit: 2015-12-03 | Discharge: 2015-12-03 | Disposition: A | Discharge status: Home / Self Care | Payer: Medicaid Other | Source: Ambulatory Visit | Attending: Maternal and Fetal Medicine | Admitting: Maternal and Fetal Medicine

## 2015-12-03 DIAGNOSIS — E669 Obesity, unspecified: Secondary | ICD-10-CM | POA: Insufficient documentation

## 2015-12-03 DIAGNOSIS — O99213 Obesity complicating pregnancy, third trimester: Secondary | ICD-10-CM | POA: Diagnosis present

## 2015-12-31 ENCOUNTER — Ambulatory Visit
Admission: RE | Admit: 2015-12-31 | Discharge: 2015-12-31 | Disposition: A | Discharge status: Home / Self Care | Payer: Medicaid Other | Source: Ambulatory Visit | Attending: Obstetrics & Gynecology | Admitting: Obstetrics & Gynecology

## 2015-12-31 VITALS — BP 109/49 | HR 87 | Temp 98.0°F | Resp 20 | Ht 71.0 in | Wt 292.0 lb

## 2015-12-31 DIAGNOSIS — Z3A36 36 weeks gestation of pregnancy: Secondary | ICD-10-CM | POA: Diagnosis not present

## 2015-12-31 DIAGNOSIS — N809 Endometriosis, unspecified: Secondary | ICD-10-CM

## 2015-12-31 DIAGNOSIS — IMO0001 Reserved for inherently not codable concepts without codable children: Secondary | ICD-10-CM

## 2015-12-31 DIAGNOSIS — O34219 Maternal care for unspecified type scar from previous cesarean delivery: Secondary | ICD-10-CM | POA: Insufficient documentation

## 2016-01-24 ENCOUNTER — Inpatient Hospital Stay
Admission: RE | Admit: 2016-01-24 | Discharge: 2016-01-24 | Disposition: A | Discharge status: Home / Self Care | Payer: Medicaid Other | Source: Ambulatory Visit | Attending: Obstetrics and Gynecology | Admitting: Obstetrics and Gynecology

## 2016-01-24 ENCOUNTER — Encounter: Payer: Self-pay | Admitting: Emergency Medicine

## 2016-01-24 ENCOUNTER — Emergency Department
Admission: EM | Admit: 2016-01-24 | Discharge: 2016-01-24 | Disposition: A | Discharge status: Home / Self Care | Payer: Medicaid Other | Attending: Emergency Medicine | Admitting: Emergency Medicine

## 2016-01-24 DIAGNOSIS — Y9389 Activity, other specified: Secondary | ICD-10-CM | POA: Diagnosis not present

## 2016-01-24 DIAGNOSIS — F17210 Nicotine dependence, cigarettes, uncomplicated: Secondary | ICD-10-CM

## 2016-01-24 DIAGNOSIS — Y999 Unspecified external cause status: Secondary | ICD-10-CM | POA: Diagnosis not present

## 2016-01-24 DIAGNOSIS — O99333 Smoking (tobacco) complicating pregnancy, third trimester: Secondary | ICD-10-CM

## 2016-01-24 DIAGNOSIS — Y92410 Unspecified street and highway as the place of occurrence of the external cause: Secondary | ICD-10-CM | POA: Diagnosis not present

## 2016-01-24 DIAGNOSIS — O26893 Other specified pregnancy related conditions, third trimester: Secondary | ICD-10-CM

## 2016-01-24 DIAGNOSIS — Z3A36 36 weeks gestation of pregnancy: Secondary | ICD-10-CM | POA: Insufficient documentation

## 2016-01-24 DIAGNOSIS — O34219 Maternal care for unspecified type scar from previous cesarean delivery: Secondary | ICD-10-CM

## 2016-01-24 DIAGNOSIS — Z9889 Other specified postprocedural states: Secondary | ICD-10-CM

## 2016-01-24 DIAGNOSIS — R109 Unspecified abdominal pain: Secondary | ICD-10-CM | POA: Insufficient documentation

## 2016-01-24 DIAGNOSIS — IMO0001 Reserved for inherently not codable concepts without codable children: Secondary | ICD-10-CM

## 2016-01-24 DIAGNOSIS — Z87891 Personal history of nicotine dependence: Secondary | ICD-10-CM | POA: Insufficient documentation

## 2016-01-24 DIAGNOSIS — Z3A32 32 weeks gestation of pregnancy: Secondary | ICD-10-CM | POA: Insufficient documentation

## 2016-01-24 DIAGNOSIS — O9A213 Injury, poisoning and certain other consequences of external causes complicating pregnancy, third trimester: Secondary | ICD-10-CM | POA: Insufficient documentation

## 2016-01-24 DIAGNOSIS — S3991XA Unspecified injury of abdomen, initial encounter: Secondary | ICD-10-CM | POA: Insufficient documentation

## 2016-01-24 MED ORDER — OXYCODONE-ACETAMINOPHEN 5-325 MG PO TABS: 1.0000 | ORAL_TABLET | ORAL | Status: DC | PRN

## 2016-01-24 MED ORDER — LACTATED RINGERS IV SOLN: 500.0000 mL | INTRAVENOUS | Status: DC | PRN

## 2016-01-24 MED ORDER — ACETAMINOPHEN 325 MG PO TABS: 650.0000 mg | ORAL_TABLET | ORAL | Status: DC | PRN

## 2016-01-24 NOTE — Discharge Instructions (Signed)
Rest at home. Tylenol for discomfort. Heat pack to neck and lower back. If worse, call clinic
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
## 2016-01-28 ENCOUNTER — Ambulatory Visit
Admission: RE | Admit: 2016-01-28 | Discharge: 2016-01-28 | Disposition: A | Discharge status: Home / Self Care | Payer: No Typology Code available for payment source | Source: Ambulatory Visit | Attending: Maternal and Fetal Medicine | Admitting: Maternal and Fetal Medicine

## 2016-02-11 NOTE — H&P (Signed)
Ashley Cooper is a 25 y.o. female presenting for elective repeat LTCS + BTL EDC12/5/17 based on 21 week u/s . LMP 05/07/15 ( EDC11/27/17) Three prior c/s . OB History    Gravida Para Term Preterm AB Living   4 3 3     3    SAB TAB Ectopic Multiple Live Births           3     No past medical history on file.  Endometriosis, LGSIL pap ,  Past Surgical History:  Procedure Laterality Date  . CESAREAN SECTION    . Csection x3     Family History: family history is not on file. Social History:  reports that she has quit smoking. Her smoking use included Cigarettes. She smoked 2.00 packs per day. She has never used smokeless tobacco. She reports that she does not drink alcohol or use drugs.     Maternal Diabetes: No Genetic Screening: Normal Maternal Ultrasounds/Referrals: Normal Fetal Ultrasounds or other Referrals:  Referred to Materal Fetal Medicine  Maternal Substance Abuse:  No Significant Maternal Medications:  None Significant Maternal Lab Results:  None Other Comments:  None  ROS History   Last menstrual period 05/07/2015. Exam Physical Exam  Lungs CTA  CV RRR  adb soft NT   Prenatal labs: ABO, Rh:  a+ Antibody:  neg  Rubella:  Imm RPR:   neg HBsAg:   neg HIV:   neg  GBS:   neg   Assessment/Plan: Elective repeat LTCS and elective BTL on 02/13/16 No on q pump    SCHERMERHORN,THOMAS 02/11/2016, 12:16 PM

## 2016-02-12 ENCOUNTER — Encounter
Admission: RE | Admit: 2016-02-12 | Discharge: 2016-02-12 | Disposition: A | Discharge status: Home / Self Care | Payer: Medicaid Other | Source: Ambulatory Visit | Attending: Obstetrics and Gynecology | Admitting: Obstetrics and Gynecology

## 2016-02-12 DIAGNOSIS — Z01812 Encounter for preprocedural laboratory examination: Secondary | ICD-10-CM | POA: Insufficient documentation

## 2016-02-12 LAB — RAPID HIV SCREEN (HIV 1/2 AB+AG)
HIV 1/2 Antibodies: NONREACTIVE
HIV-1 P24 Antigen - HIV24: NONREACTIVE

## 2016-02-12 LAB — TYPE AND SCREEN
ABO/RH(D): A POS
ANTIBODY SCREEN: NEGATIVE
EXTEND SAMPLE REASON: UNDETERMINED

## 2016-02-12 LAB — CBC
HEMATOCRIT: 35.6 % (ref 35.0–47.0)
Hemoglobin: 12.2 g/dL (ref 12.0–16.0)
MCH: 27.9 pg (ref 26.0–34.0)
MCHC: 34.2 g/dL (ref 32.0–36.0)
MCV: 81.3 fL (ref 80.0–100.0)
PLATELETS: 250 10*3/uL (ref 150–440)
RBC: 4.38 MIL/uL (ref 3.80–5.20)
RDW: 15.4 % — ABNORMAL HIGH (ref 11.5–14.5)
WBC: 8.1 10*3/uL (ref 3.6–11.0)

## 2016-02-12 MED ORDER — LACTATED RINGERS IV SOLN
INTRAVENOUS | Status: DC
  Filled 2016-02-12: qty 1000

## 2016-02-12 NOTE — Patient Instructions (Signed)
Your procedure is scheduled JW:JXBJYNWGNon:WEDNESDAY February 13, 2016 Su procedimiento est programado para: Report to [facility] EM[facility]PARTMENT AT 7 AM Presntese a:   Remember: Instructions that are not followed completely may result in serious medical risk, up to and including death, or upon the discretion of your surgeon and anesthesiologist your surgery may need to be rescheduled.  Recuerde: Las instrucciones que no se siguen completamente Armed forces logistics/support/administrative officerpueden resultar en un riesgo de salud grave, incluyendo hasta la [place]te o a discrecin de su cirujano y Scientific laboratory techniciananestesilogo, su ciruga se puede posponer.   __X__ 1. Do not eat food or drink liquids after midnight. No gum chewing or hard candies.  No coma alimentos ni tome lquidos despus de la medianoche.  No mastique chicle ni caramelos  duros.     __X__ 2. No alcohol for 24 hours before or after surgery.    No tome alcohol durante las 24 horas antes ni despus de la [place].   ____ 3. Bring all medications with you on the day of surgery if instructed.    Lleve todos los medicamentos con usted el da de su ciruga si se le ha indicado as.   __X__ 4. Notify your doctor if there is any change in your medical condition (cold, fever,                             infections).    Informe a su mdico si hay algn cambio en su condicin mdica (resfriado, fiebre, infecciones).   Do not wear jewelry, make-up, hairpins, clips or nail polish.  No use joyas, maquillajes, pinzas/ganchos para el cabello ni esmalte de uas.  Do not wear lotions, powders, or perfumes. You may wear deodorant.  No use lociones, polvos o perfumes.  Puede usar desodorante.    Do not shave 48 hours prior to surgery. Men may shave face and neck.  No se afeite 48 horas antes de la [place].  Los hombres pueden Commercial Metals Companyafeitarse la cara y el cuello.   Do not bring valuables to the hospital.   No lleve objetos de valor al hospital.  [facility] any belongings or valuables.  [facility] no se hace responsable de ningn tipo de pertenencias u objetos de Licensed conveyancervalor.               Contacts, dentures or bridgework may not be worn into surgery.  Los lentes de [place], las dentaduras postizas o puentes no se pueden usar en la [place].  Leave your suitcase in the car. After surgery it may be brought to your room.  Deje su maleta en el auto.  Despus de la ciruga podr traerla a su habitacin.  For patients admitted to the hospital, discharge time is determined by your treatment team.  Para los pacientes que sean ingresados al hospital, el tiempo en el cual se le dar de alta es determinado por su                equipo de Mel[place]ratamiento.   Patients discharged the day of surgery will not be allowed to drive home. A los pacientes que se les da de alta el mismo da de la ciruga no se les permitir conducir a Higher education careers advisercasa.   Please read over the following fact sheets that you were given: Por favor lea las siguientes hojas de informacin que le dieron:   CHG INSTRUCTION SHEET  ____ Take these medicines the morning of surgery with  A SIP OF WATER:          Johnson & Johnsonome estas medicinas la maana de la ciruga con UN SORBO DE AGUA:  1. MAY TAKE PRENATAL VITAMIN WITH SIP OF WATER  2.   3.   4.       5.  6.  ____ Fleet Enema (as directed)          Enema de Fleet (segn lo indicado)    __X__ Use CHG Soap as directed          Utilice el jabn de CHG segn lo indicado  ____ Use inhalers on the day of surgery          Use los inhaladores el da de la ciruga  ____ Stop metformin 2 days prior to surgery          Deje de tomar el metformin 2 das antes de la ciruga    ____ Take 1/2 of usual insulin dose the night before surgery and none on the morning of surgery           Tome la mitad de la dosis habitual de insulina la noche antes de la [place] y no tome nada en la maana de la             ciruga  ____ Stop Coumadin/Plavix/aspirin on             Deje de tomar el Coumadin/Plavix/aspirina el  da:  __X__ Stop Anti-inflammatories UNTIL AFTER SURGERY MAY TAKE TYLENOL          Deje de tomar antiinflamatorios el da:   ____ Stop supplements until after surgery            Deje de tomar suplementos hasta despus de la ciruga  ____ Bring C-Pap to the hospital          Lleve el C-Pap al hospital

## 2016-02-13 ENCOUNTER — Inpatient Hospital Stay
Admission: RE | Admit: 2016-02-13 | Discharge: 2016-02-16 | DRG: 765 | Disposition: A | Discharge status: Home / Self Care | Payer: Medicaid Other | Source: Ambulatory Visit | Attending: Obstetrics and Gynecology | Admitting: Obstetrics and Gynecology

## 2016-02-13 ENCOUNTER — Encounter: Payer: Self-pay | Admitting: *Deleted

## 2016-02-13 ENCOUNTER — Encounter
Admission: RE | Disposition: A | Discharge status: Home / Self Care | Payer: Self-pay | Source: Ambulatory Visit | Attending: Obstetrics and Gynecology

## 2016-02-13 ENCOUNTER — Inpatient Hospital Stay: Payer: Medicaid Other | Admitting: Certified Registered"

## 2016-02-13 DIAGNOSIS — Z87891 Personal history of nicotine dependence: Secondary | ICD-10-CM

## 2016-02-13 DIAGNOSIS — Z302 Encounter for sterilization: Secondary | ICD-10-CM | POA: Diagnosis not present

## 2016-02-13 DIAGNOSIS — Z3A39 39 weeks gestation of pregnancy: Secondary | ICD-10-CM | POA: Diagnosis not present

## 2016-02-13 DIAGNOSIS — O99214 Obesity complicating childbirth: Secondary | ICD-10-CM | POA: Diagnosis present

## 2016-02-13 DIAGNOSIS — O34211 Maternal care for low transverse scar from previous cesarean delivery: Secondary | ICD-10-CM | POA: Diagnosis present

## 2016-02-13 DIAGNOSIS — Z6841 Body Mass Index (BMI) 40.0 and over, adult: Secondary | ICD-10-CM

## 2016-02-13 DIAGNOSIS — O9081 Anemia of the puerperium: Secondary | ICD-10-CM | POA: Diagnosis not present

## 2016-02-13 DIAGNOSIS — Z9889 Other specified postprocedural states: Secondary | ICD-10-CM

## 2016-02-13 LAB — CHLAMYDIA/NGC RT PCR (ARMC ONLY)
Chlamydia Tr: NOT DETECTED
N gonorrhoeae: NOT DETECTED

## 2016-02-13 LAB — RPR: RPR: NONREACTIVE

## 2016-02-13 SURGERY — Surgical Case
Anesthesia: Spinal | Laterality: Bilateral | Wound class: Clean Contaminated

## 2016-02-13 MED ORDER — MENTHOL 3 MG MT LOZG
1.0000 | LOZENGE | OROMUCOSAL | Status: DC | PRN
  Filled 2016-02-13: qty 9

## 2016-02-13 MED ORDER — SIMETHICONE 80 MG PO CHEW
80.0000 mg | CHEWABLE_TABLET | Freq: Three times a day (TID) | ORAL | Status: DC
  Administered 2016-02-13 – 2016-02-16 (×7): 80 mg via ORAL
  Filled 2016-02-13 (×7): qty 1

## 2016-02-13 MED ORDER — OXYTOCIN 40 UNITS IN LACTATED RINGERS INFUSION - SIMPLE MED
INTRAVENOUS | Status: DC | PRN
  Administered 2016-02-13: 500 mL via INTRAVENOUS

## 2016-02-13 MED ORDER — CEFAZOLIN SODIUM-DEXTROSE 2-4 GM/100ML-% IV SOLN
2.0000 g | INTRAVENOUS | Status: AC
  Administered 2016-02-13: 2 g via INTRAVENOUS
  Filled 2016-02-13: qty 100

## 2016-02-13 MED ORDER — WITCH HAZEL-GLYCERIN EX PADS: 1.0000 "application " | MEDICATED_PAD | CUTANEOUS | Status: DC | PRN

## 2016-02-13 MED ORDER — PHENYLEPHRINE HCL 10 MG/ML IJ SOLN
INTRAMUSCULAR | Status: DC | PRN
  Administered 2016-02-13 (×3): 100 ug via INTRAVENOUS

## 2016-02-13 MED ORDER — ZOLPIDEM TARTRATE 5 MG PO TABS: 5.0000 mg | ORAL_TABLET | Freq: Every evening | ORAL | Status: DC | PRN

## 2016-02-13 MED ORDER — MORPHINE SULFATE (PF) 0.5 MG/ML IJ SOLN
INTRAMUSCULAR | Status: DC | PRN
  Administered 2016-02-13: 100 ug via EPIDURAL

## 2016-02-13 MED ORDER — LACTATED RINGERS IV SOLN
INTRAVENOUS | Status: DC
  Administered 2016-02-13: 09:00:00 via INTRAVENOUS

## 2016-02-13 MED ORDER — SIMETHICONE 80 MG PO CHEW
80.0000 mg | CHEWABLE_TABLET | ORAL | Status: DC
  Administered 2016-02-13 – 2016-02-15 (×3): 80 mg via ORAL
  Filled 2016-02-13 (×3): qty 1

## 2016-02-13 MED ORDER — PRENATAL MULTIVITAMIN CH
1.0000 | ORAL_TABLET | Freq: Every day | ORAL | Status: DC
  Administered 2016-02-13 – 2016-02-16 (×4): 1 via ORAL
  Filled 2016-02-13 (×4): qty 1

## 2016-02-13 MED ORDER — IBUPROFEN 600 MG PO TABS
600.0000 mg | ORAL_TABLET | Freq: Four times a day (QID) | ORAL | Status: DC
  Administered 2016-02-13 (×2): 600 mg via ORAL
  Filled 2016-02-13 (×3): qty 1

## 2016-02-13 MED ORDER — FENTANYL CITRATE (PF) 100 MCG/2ML IJ SOLN: 25.0000 ug | INTRAMUSCULAR | Status: DC | PRN

## 2016-02-13 MED ORDER — DIPHENHYDRAMINE HCL 25 MG PO CAPS: 25.0000 mg | ORAL_CAPSULE | Freq: Four times a day (QID) | ORAL | Status: DC | PRN

## 2016-02-13 MED ORDER — OXYCODONE-ACETAMINOPHEN 5-325 MG PO TABS
2.0000 | ORAL_TABLET | ORAL | Status: DC | PRN
  Administered 2016-02-14 – 2016-02-16 (×9): 2 via ORAL
  Filled 2016-02-13 (×10): qty 2

## 2016-02-13 MED ORDER — COCONUT OIL OIL: 1.0000 "application " | TOPICAL_OIL | Status: DC | PRN

## 2016-02-13 MED ORDER — DIBUCAINE 1 % RE OINT: 1.0000 "application " | TOPICAL_OINTMENT | RECTAL | Status: DC | PRN

## 2016-02-13 MED ORDER — BUPIVACAINE HCL (PF) 0.5 % IJ SOLN
INTRAMUSCULAR | Status: DC | PRN
  Administered 2016-02-13: 3 mL

## 2016-02-13 MED ORDER — ACETAMINOPHEN 325 MG PO TABS: 650.0000 mg | ORAL_TABLET | ORAL | Status: DC | PRN

## 2016-02-13 MED ORDER — SOD CITRATE-CITRIC ACID 500-334 MG/5ML PO SOLN
30.0000 mL | ORAL | Status: AC
  Administered 2016-02-13: 30 mL via ORAL
  Filled 2016-02-13: qty 15

## 2016-02-13 MED ORDER — TETANUS-DIPHTH-ACELL PERTUSSIS 5-2.5-18.5 LF-MCG/0.5 IM SUSP: 0.5000 mL | Freq: Once | INTRAMUSCULAR | Status: DC

## 2016-02-13 MED ORDER — LACTATED RINGERS IV BOLUS (SEPSIS)
1000.0000 mL | Freq: Once | INTRAVENOUS | Status: AC
  Administered 2016-02-13: 1000 mL via INTRAVENOUS

## 2016-02-13 MED ORDER — OXYCODONE-ACETAMINOPHEN 5-325 MG PO TABS
1.0000 | ORAL_TABLET | ORAL | Status: DC | PRN
  Administered 2016-02-13 – 2016-02-14 (×2): 1 via ORAL
  Filled 2016-02-13 (×2): qty 1

## 2016-02-13 MED ORDER — SIMETHICONE 80 MG PO CHEW: 80.0000 mg | CHEWABLE_TABLET | ORAL | Status: DC | PRN

## 2016-02-13 MED ORDER — LACTATED RINGERS IV SOLN
INTRAVENOUS | Status: DC
  Administered 2016-02-13 – 2016-02-14 (×2): via INTRAVENOUS

## 2016-02-13 MED ORDER — FENTANYL CITRATE (PF) 100 MCG/2ML IJ SOLN
INTRAMUSCULAR | Status: DC | PRN
  Administered 2016-02-13: 15 ug via INTRAVENOUS

## 2016-02-13 MED ORDER — CEFAZOLIN SODIUM 1 G IJ SOLR
INTRAMUSCULAR | Status: DC | PRN
  Administered 2016-02-13: 1 g via INTRAMUSCULAR

## 2016-02-13 MED ORDER — OXYTOCIN 40 UNITS IN LACTATED RINGERS INFUSION - SIMPLE MED
2.5000 [IU]/h | INTRAVENOUS | Status: AC
  Filled 2016-02-13: qty 1000

## 2016-02-13 MED ORDER — ONDANSETRON HCL 4 MG/2ML IJ SOLN: 4.0000 mg | Freq: Once | INTRAMUSCULAR | Status: DC | PRN

## 2016-02-13 MED ORDER — SENNOSIDES-DOCUSATE SODIUM 8.6-50 MG PO TABS
2.0000 | ORAL_TABLET | ORAL | Status: DC
  Administered 2016-02-14 – 2016-02-15 (×3): 2 via ORAL
  Filled 2016-02-13 (×4): qty 2

## 2016-02-13 MED ORDER — ONDANSETRON HCL 4 MG/2ML IJ SOLN
INTRAMUSCULAR | Status: DC | PRN
  Administered 2016-02-13: 4 mg via INTRAVENOUS

## 2016-02-13 SURGICAL SUPPLY — 27 items
BARRIER ADHS 3X4 INTERCEED (GAUZE/BANDAGES/DRESSINGS) ×3 IMPLANT
CANISTER SUCT 3000ML (MISCELLANEOUS) ×3 IMPLANT
CATH KIT ON-Q SILVERSOAK 5IN (CATHETERS) ×6 IMPLANT
CHLORAPREP W/TINT 26ML (MISCELLANEOUS) ×3 IMPLANT
DRESSING TELFA 4X3 1S ST N-ADH (GAUZE/BANDAGES/DRESSINGS) ×3 IMPLANT
DRSG TELFA 3X8 NADH (GAUZE/BANDAGES/DRESSINGS) ×3 IMPLANT
ELECT CAUTERY BLADE 6.4 (BLADE) ×3 IMPLANT
ELECT REM PT RETURN 9FT ADLT (ELECTROSURGICAL) ×3
ELECTRODE REM PT RTRN 9FT ADLT (ELECTROSURGICAL) ×1 IMPLANT
GAUZE SPONGE 4X4 12PLY STRL (GAUZE/BANDAGES/DRESSINGS) ×3 IMPLANT
GLOVE BIO SURGEON STRL SZ8 (GLOVE) ×12 IMPLANT
GOWN STRL REUS W/ TWL LRG LVL3 (GOWN DISPOSABLE) ×2 IMPLANT
GOWN STRL REUS W/ TWL XL LVL3 (GOWN DISPOSABLE) ×1 IMPLANT
GOWN STRL REUS W/TWL LRG LVL3 (GOWN DISPOSABLE) ×4
GOWN STRL REUS W/TWL XL LVL3 (GOWN DISPOSABLE) ×2
NS IRRIG 1000ML POUR BTL (IV SOLUTION) ×3 IMPLANT
PACK C SECTION AR (MISCELLANEOUS) ×3 IMPLANT
PAD OB MATERNITY 4.3X12.25 (PERSONAL CARE ITEMS) ×3 IMPLANT
PAD PREP 24X41 OB/GYN DISP (PERSONAL CARE ITEMS) ×3 IMPLANT
SPONGE LAP 18X18 5 PK (GAUZE/BANDAGES/DRESSINGS) ×3 IMPLANT
STRAP SAFETY BODY (MISCELLANEOUS) ×3 IMPLANT
SUCT VACUUM KIWI BELL (SUCTIONS) ×3 IMPLANT
SUT CHROMIC 1 CTX 36 (SUTURE) ×9 IMPLANT
SUT PLAIN GUT 0 (SUTURE) ×6 IMPLANT
SUT VIC AB 0 CT1 36 (SUTURE) ×6 IMPLANT
TUBE CONNECTING  6'X3/16 (MISCELLANEOUS) ×2
TUBE CONNECTING 6X3/16 (MISCELLANEOUS) ×4 IMPLANT

## 2016-02-13 NOTE — Anesthesia Procedure Notes (Signed)
Spinal

## 2016-02-13 NOTE — Transfer of Care (Signed)
2Immediate Anesthesia Transfer of Care Note  Patient: Ashley Cooper  Procedure(s) Performed: Procedure(s) with comments: CESAREAN SECTION WITH BILATERAL TUBAL LIGATION (Bilateral) - Placenta Partial Adhesion to Uterine Wall   Patient Location: PACU  Anesthesia Type:Spinal  Level of Consciousness: awake, alert  and oriented  Airway & Oxygen Therapy: Patient Spontanous Breathing  Post-op Assessment: Report given to RN and Post -op Vital signs reviewed and stable  Post vital signs: Reviewed and stable  Last Vitals:  Vitals:   02/13/16 0728  BP: (!) 112/55  Pulse: 76  Resp: 18  Temp: 36.4 C    Last Pain:  Vitals:   02/13/16 0728  TempSrc: Oral         Complications: No apparent anesthesia complications

## 2016-02-13 NOTE — Anesthesia Preprocedure Evaluation (Signed)
Anesthesia Evaluation  Patient identified by MRN, date of birth, ID band Patient awake    Reviewed: Allergy & Precautions, NPO status , Patient's Chart, lab work & pertinent test results  Airway Mallampati: II       Dental  (+) Teeth Intact   Pulmonary neg pulmonary ROS, former smoker,    breath sounds clear to auscultation       Cardiovascular Exercise Tolerance: Good  Rhythm:Regular Rate:Normal     Neuro/Psych negative neurological ROS  negative psych ROS   GI/Hepatic negative GI ROS, Neg liver ROS,   Endo/Other  negative endocrine ROSMorbid obesity  Renal/GU negative Renal ROS     Musculoskeletal negative musculoskeletal ROS (+)   Abdominal (+) + obese,   Peds  Hematology   Anesthesia Other Findings   Reproductive/Obstetrics                             Anesthesia Physical Anesthesia Plan  ASA: III  Anesthesia Plan: Spinal   Post-op Pain Management:    Induction:   Airway Management Planned: Natural Airway and Nasal Cannula  Additional Equipment:   Intra-op Plan:   Post-operative Plan:   Informed Consent: I have reviewed the patients History and Physical, chart, labs and discussed the procedure including the risks, benefits and alternatives for the proposed anesthesia with the patient or authorized representative who has indicated his/her understanding and acceptance.     Plan Discussed with: CRNA  Anesthesia Plan Comments:         Anesthesia Quick Evaluation

## 2016-02-13 NOTE — Brief Op Note (Signed)
02/13/2016  10:16 AM  PATIENT:  Ashley Cooper  25 y.o. female  PRE-OPERATIVE DIAGNOSIS:  Prior C/S  Sterilization  POST-OPERATIVE DIAGNOSIS:  Prior C/S ,elective Sterilization Placenta adherence to anterior uterine wall  PROCEDURE:  Procedure(s) with comments: CESAREAN SECTION WITH BILATERAL TUBAL LIGATION (Bilateral) - Placenta Partial Adhesion to Uterine Wall   SURGEON:  Surgeon(s) and Role:    Suzy Bouchard* Luis Sami J Shavana Calder, MD - Primary  PHYSICIAN ASSISTANT: scrub tech  ASSISTANTS: none   ANESTHESIA:   spinal  EBL:  Total I/O In: 400 [I.V.:400] Out: 600 [Blood:600]  BLOOD ADMINISTERED:none  DRAINS: Urinary Catheter (Foley)   LOCAL MEDICATIONS USED:  NONE  SPECIMEN:  Source of Specimen:  placenta, prtion right and left tube   DISPOSITION OF SPECIMEN:  PATHOLOGY  COUNTS:  YES  TOURNIQUET:  * No tourniquets in log *  DICTATION: .Other Dictation: Dictation Number verbal  PLAN OF CARE: Admit to inpatient   PATIENT DISPOSITION:  PACU - hemodynamically stable.   Delay start of Pharmacological VTE agent (>24hrs) due to surgical blood loss or risk of bleeding: not applicable

## 2016-02-13 NOTE — Anesthesia Procedure Notes (Addendum)
Spinal  Start time: 02/13/2016 9:10 AM End time: 02/13/2016 9:15 AM Staffing Anesthesiologist: Elijio MilesVAN STAVEREN, GIJSBERTUS F Performed: anesthesiologist  Preanesthetic Checklist Completed: patient identified, site marked, surgical consent, pre-op evaluation, timeout performed, IV checked, risks and benefits discussed and monitors and equipment checked Spinal Block Patient position: sitting Prep: Betadine Patient monitoring: heart rate, continuous pulse ox and blood pressure Approach: midline Location: L2-3 Injection technique: single-shot Needle Needle type: Quincke  Needle gauge: 25 G Needle length: 9 cm Needle insertion depth: 8 cm Assessment Sensory level: T4

## 2016-02-13 NOTE — Anesthesia Procedure Notes (Signed)
Performed by: Alyssabeth Bruster Pre-anesthesia Checklist: Patient identified, Emergency Drugs available, Suction available, Patient being monitored and Timeout performed Oxygen Delivery Method: Nasal cannula       

## 2016-02-13 NOTE — Progress Notes (Signed)
Patient ID: Ashley Cooper, female   DOB: 1990/10/21, 25 y.o.   MRN: [id number] Ready for repeat LTCS and BTL elective .NPO . All questins answered

## 2016-02-14 ENCOUNTER — Encounter: Payer: Self-pay | Admitting: Obstetrics and Gynecology

## 2016-02-14 LAB — CBC
HCT: 30.5 % — ABNORMAL LOW (ref 35.0–47.0)
HEMOGLOBIN: 10.3 g/dL — AB (ref 12.0–16.0)
MCH: 27.6 pg (ref 26.0–34.0)
MCHC: 33.6 g/dL (ref 32.0–36.0)
MCV: 82.3 fL (ref 80.0–100.0)
PLATELETS: 205 10*3/uL (ref 150–440)
RBC: 3.71 MIL/uL — AB (ref 3.80–5.20)
RDW: 15.5 % — ABNORMAL HIGH (ref 11.5–14.5)
WBC: 11.6 10*3/uL — ABNORMAL HIGH (ref 3.6–11.0)

## 2016-02-14 MED ORDER — IBUPROFEN 600 MG PO TABS
600.0000 mg | ORAL_TABLET | Freq: Four times a day (QID) | ORAL | Status: DC
  Administered 2016-02-14 – 2016-02-16 (×10): 600 mg via ORAL
  Filled 2016-02-14 (×10): qty 1

## 2016-02-14 MED ORDER — FERROUS SULFATE 325 (65 FE) MG PO TABS
325.0000 mg | ORAL_TABLET | Freq: Every day | ORAL | Status: DC
  Administered 2016-02-15 – 2016-02-16 (×2): 325 mg via ORAL
  Filled 2016-02-14 (×2): qty 1

## 2016-02-14 NOTE — Op Note (Signed)
Ashley Cooper, Ashley Cooper             ACCOUNT NO.:  [id number]  MEDICAL RECORD NO.:  [id number]  LOCATION:                                 FACILITY:  PHYSICIAN:  Jennell Cornerhomas Schermerhorn, MDDATE OF BIRTH:  1990-09-27  DATE OF PROCEDURE: DATE OF DISCHARGE:                              OPERATIVE REPORT   PREOPERATIVE DIAGNOSIS: 1. Elective repeat cesarean section. 2. Elective permanent sterilization.  POSTOPERATIVE DIAGNOSIS: 1. Elective repeat cesarean section. 2. Elective permanent sterilization. 3. Placental adherence to the anterior uterine wall.  ANESTHESIA:  Spinal.  SURGEON:  Jennell Cornerhomas Schermerhorn, MD.  FIRST ASSISTANT:  Scrub tech.  INDICATION:  A 25 year old, gravida 4, para 3 patient with 3 prior cesarean sections, EDC of February 19, 2016, based on 21 week ultrasound. Patient elects for a repeat cesarean section and elective permanent sterilization.  DESCRIPTION OF PROCEDURE:  After adequate spinal anesthesia, patient was placed in dorsal supine position and hip rolled on the right side.  The patient received 3 g IV Ancef prior to commencement of the case.  Time- out was performed.  A Pfannenstiel incision was then made.  Sharp dissection was used to identify the fascia which was opened in the midline and opened in a transverse fashion.  Approximately 5 cm of subcutaneous tissue encountered to get to the fascia.  Anterior fascia was grasped with Kocher clamps and the recti muscles were dissected free.  Inferior aspect of the fascia was grasped with Kocher clamps and pyramidalis muscles dissected free.  Entry into the peritoneal cavity was accomplished sharply.  The vesicular uterine peritoneal fold was identified and given prior scarring, a direct low uterine segment transverse incision was made upon entry into the endometrial cavity, clear fluid resulted.  The incision was extended with blunt transverse traction.  Fetal head was brought to the incision.  A Kiwi bell  vacuum was then placed on the infant's occiput.  With 1 gentle pull, the head was delivered and the loose nuchal cord was reduced and shoulders and body of the infant were delivered without difficulty.  Vigorous female was dried on the abdomen.  Cord was clamped after 60 seconds, passed to neonatologist who assigned Apgar scores of 9 and 10.  Intravenous Pitocin was administered.  The placenta was manually delivered.  There was adherence to the anterior abdominal wall, but cleaved free without significant adhesions.  Uterus was exteriorized and the endometrial cavity was wiped clean with laparotomy tape and ring forceps was used to open the cervix and this was passed off the operative field.  Uterine incision was closed with 1 chromic suture in a running locking fashion. Good approximation of edges.  Good hemostasis was noted.  Evaluation of the adherent area demonstrated no significant bleeding, but some roughed up myometrial edges were noted.  Attention was directed to the patient's left fallopian tube which was grasped with the midportion of the fallopian tube, 2 separate 0 plain gut sutures were placed and a 1.5 cm portion of fallopian tube was removed.  Similar procedure was repeated on the patient's right fallopian tube after placing 2 separate 0 plain gut sutures, a 1.5 cm portion of fallopian tube was removed.  Ovaries appeared normal.  Uterus  was placed back into the abdominal cavity and the uterine incision again appeared hemostatic.  Abdomen was irrigated and the paracolic gutters were wiped clean with laparotomy tape.  Tubal ligation sites appeared hemostatic as well as uterine incision.  Fascia was reapproximated with 0 Vicryl suture in a running nonlocking fashion. Good approximation of edges.  Subcutaneous tissues were then irrigated and bovied, given the depth of the subcutaneous tissues, a 2-0 chromic suture was used to close dead space and suture was placed in a  running fashion.  Skin was reapproximated with staples and there were no complications.  ESTIMATED BLOOD LOSS:  600 mL.  INTRAOPERATIVE FLUIDS:  800 mL.  The patient was taken to recovery room in good condition.    ______________________________ Jennell Cornerhomas Schermerhorn, MD   ______________________________ Jennell Cornerhomas Schermerhorn, MD    TS/MEDQ  D:  02/13/2016  T:  02/14/2016  Job:  [id number]

## 2016-02-14 NOTE — Progress Notes (Signed)
POSTOPERATIVE DAY # 1 S/P Elective Repeat C/S and BTL    S:         Reports feeling good, but more sore than her last LTCS             Tolerating po intake / no nausea / no vomiting / + flatus / no BM             Bleeding is light             Pain controlled with Motrin and Percocet             Foley out this morning, has not voided on her own yet, will be 8 hours at 2pm, has not ambulated yet   Newborn formula feeding   O:  VS: BP (!) 109/56 (BP Location: Left Arm) Comment: nurse Ilsa IhaEli Quiroz notified  Pulse 73   Temp 97.7 F (36.5 C) (Oral)   Resp 18   Ht 5\' 11"  (1.803 m)   Wt 132.5 kg (292 lb)   LMP 05/07/2015 (Exact Date)   SpO2 98%   Breastfeeding? Unknown   BMI 40.73 kg/m    LABS:               Recent Labs  02/12/16 0845 02/14/16 0432  WBC 8.1 11.6*  HGB 12.2 10.3*  PLT 250 205               Bloodtype: --/--/A POS (11/28 0845)  Rubella: Immune (05/25 0000)                                             I&O: Intake/Output      11/29 0701 - 11/30 0700 11/30 0701 - 12/01 0700   I.V. (mL/kg) 2966.7 (22.4)    Total Intake(mL/kg) 2966.7 (22.4)    Urine (mL/kg/hr) 1775    Blood 600    Total Output 2375     Net +591.7                       Physical Exam:             Alert and Oriented X3  Lungs: Clear and unlabored  Heart: regular rate and rhythm / no mumurs  Abdomen: soft, non-tender, non-distended, active bowel sounds in all quadrants             Fundus: firm, non-tender, U-1             Dressing: c/d/i              Incision:  approximated with staples / unable to visualize with dressing on  Perineum: intact  Lochia: appropriate  Extremities: mild non-pitting edema, no calf pain or tenderness,   A:        POD # 1 S/P Elective repeat and BTL            Mild ABL Anemia   P:        Routine postoperative care              Oral FE daily   Call if unable to void by 2pm  Ambulate today  Continue current care  Possible d/c tomorrow   Carlean JewsMeredith Uzoma Vivona, CNM

## 2016-02-14 NOTE — Anesthesia Post-op Follow-up Note (Signed)
  Anesthesia Pain Follow-up Note  Patient: Ashley Cooper  Day #: 1  Date of Follow-up: 02/14/2016 Time: 8:28 AM  Last Vitals:  Vitals:   02/14/16 0411 02/14/16 0827  BP: (!) 120/55 (!) 109/56  Pulse: 80 73  Resp: 20 18  Temp: 36.7 C 36.5 C    Level of Consciousness: alert  Pain: 5 /10   Side Effects:None  Catheter Site Exam:clean, dry, no drainage     Plan: D/C from anesthesia care  Starling Mannsurtis,  Icker Swigert A

## 2016-02-14 NOTE — Anesthesia Postprocedure Evaluation (Signed)
Anesthesia Post Note  Patient: Ashley Cooper  Procedure(s) Performed: Procedure(s) (LRB): CESAREAN SECTION WITH BILATERAL TUBAL LIGATION (Bilateral)  Patient location during evaluation: Mother Baby Anesthesia Type: Spinal Level of consciousness: oriented and awake and alert Pain management: pain level controlled Vital Signs Assessment: post-procedure vital signs reviewed and stable Respiratory status: spontaneous breathing, respiratory function stable and patient connected to nasal cannula oxygen Cardiovascular status: blood pressure returned to baseline and stable Postop Assessment: no headache and no backache Anesthetic complications: no    Last Vitals:  Vitals:   02/14/16 0411 02/14/16 0827  BP: (!) 120/55 (!) 109/56  Pulse: 80 73  Resp: 20 18  Temp: 36.7 C 36.5 C    Last Pain:  Vitals:   02/14/16 0827  TempSrc: Oral  PainSc:                  Starling Mannsurtis,  Aland Chestnutt A

## 2016-02-15 LAB — SURGICAL PATHOLOGY

## 2016-02-15 MED ORDER — DOCUSATE SODIUM 100 MG PO CAPS: 100.0000 mg | ORAL_CAPSULE | Freq: Every day | ORAL | 3 refills | Status: DC | PRN

## 2016-02-15 MED ORDER — ONDANSETRON 4 MG PO TBDP: 4.0000 mg | ORAL_TABLET | Freq: Three times a day (TID) | ORAL | 0 refills | Status: DC | PRN

## 2016-02-15 MED ORDER — IBUPROFEN 800 MG PO TABS: 800.0000 mg | ORAL_TABLET | Freq: Three times a day (TID) | ORAL | 0 refills | Status: AC | PRN

## 2016-02-15 MED ORDER — OXYCODONE-ACETAMINOPHEN 5-325 MG PO TABS: 1.0000 | ORAL_TABLET | Freq: Four times a day (QID) | ORAL | 0 refills | Status: DC | PRN

## 2016-02-15 NOTE — Discharge Instructions (Signed)

## 2016-02-15 NOTE — Discharge Summary (Signed)
Obstetric Discharge Summary Reason for Admission: cesarean section Prenatal Procedures: none Intrapartum Procedures: cesarean: low cervical, transverse and tubal ligation Postpartum Procedures: none Complications-Operative and Postpartum: none Hemoglobin  Date Value Ref Range Status  02/14/2016 10.3 (L) 12.0 - 16.0 g/dL Final   HGB  Date Value Ref Range Status  03/28/2014 13.5 12.0 - 16.0 g/dL Final   HCT  Date Value Ref Range Status  02/14/2016 30.5 (L) 35.0 - 47.0 % Final  03/30/2014 31.9 (L) 35.0 - 47.0 % Final    Physical Exam:  General: alert, cooperative and appears stated age Lochia: appropriate Uterine Fundus: firm Incision: healing well, no significant drainage, no dehiscence, no significant erythema.  Staples. DVT Evaluation: No evidence of DVT seen on physical exam.  Discharge Diagnoses: Term Pregnancy-delivered  Discharge Information: Date: 02/16/2016 Activity: pelvic rest Diet: routine Medications: Ibuprofen, Colace and Percocet Condition: stable Instructions: refer to practice specific booklet Discharge to: home Follow-up Information    SCHERMERHORN,THOMAS, MD. Call in 2 week(s).   Specialty:  Obstetrics and Gynecology Why:  postop check Contact information: [address] [facility] [place] [id number] [phone]           Newborn Data: Live born female  Birth Weight: 7 lb 1.2 oz (3210 g) APGAR: 9, 10  Home with mother.  Ashley Cooper C Ashley Cooper 02/16/2016, 11:33 AM

## 2016-02-16 NOTE — Progress Notes (Signed)
Dc instructions given. Rx given. Follow up instructions given. Pt understands when to call MD. Also no driving for 1 week and while taking narcotic pain meds. Pt verbalizes understanding. Dc home

## 2017-08-12 IMAGING — US US OB TRANSVAGINAL
1 series · 14 of 28 positions shown · non-contrast
Comparison: None.

CLINICAL DATA: Pelvic pain for 3 or 4 weeks.

EXAM:
OBSTETRIC <14 WK US AND TRANSVAGINAL OB US
TECHNIQUE: Both transabdominal and transvaginal ultrasound examinations were
performed for complete evaluation of the gestation as well as the
maternal uterus, adnexal regions, and pelvic cul-de-sac.
Transvaginal technique was performed to assess early pregnancy.

[Series 1: us ob transvaginal · 0.25mm/px · 14 of 54 slices shown]
[im 2/54]
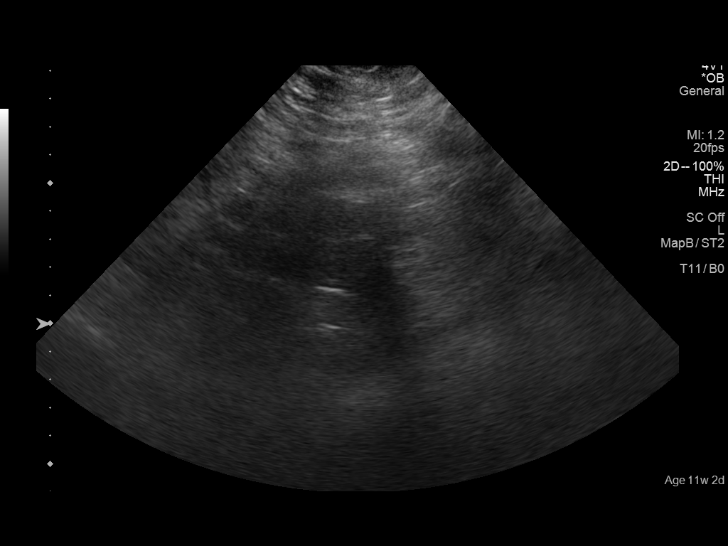
[im 6/54]
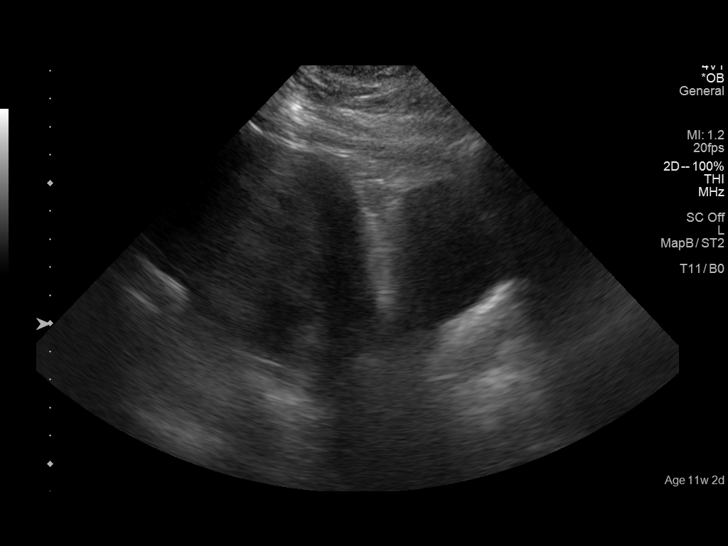
[im 10/54]
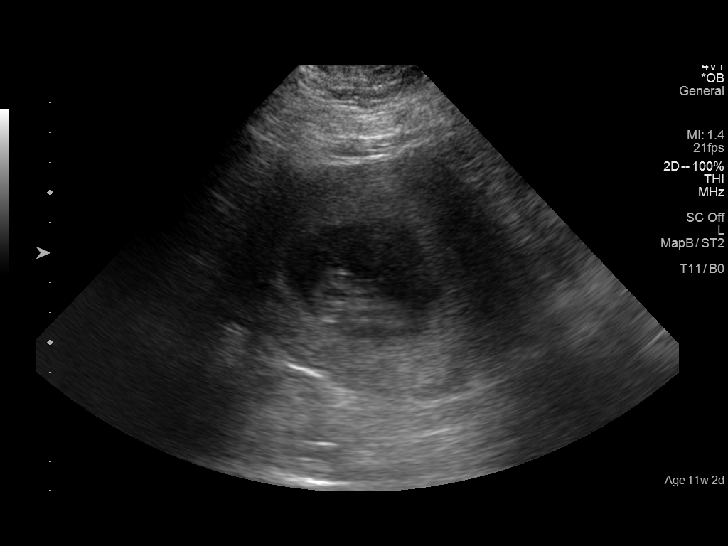
[im 14/54]
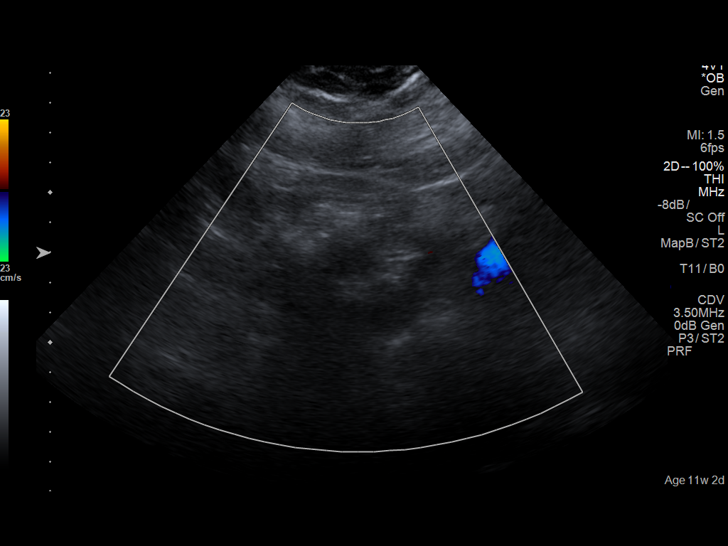
[im 18/54]
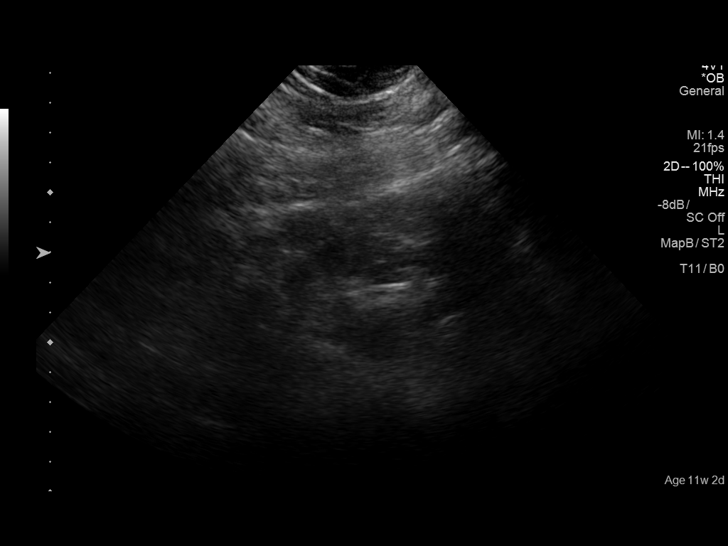
[im 22/54]
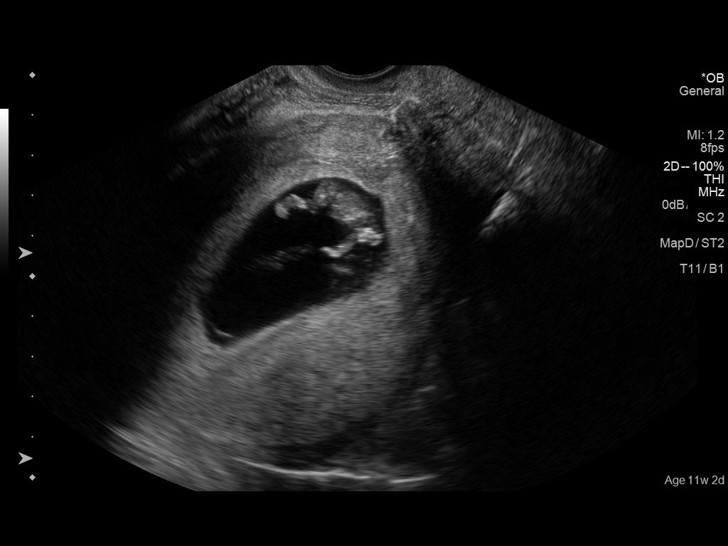
[im 26/54]
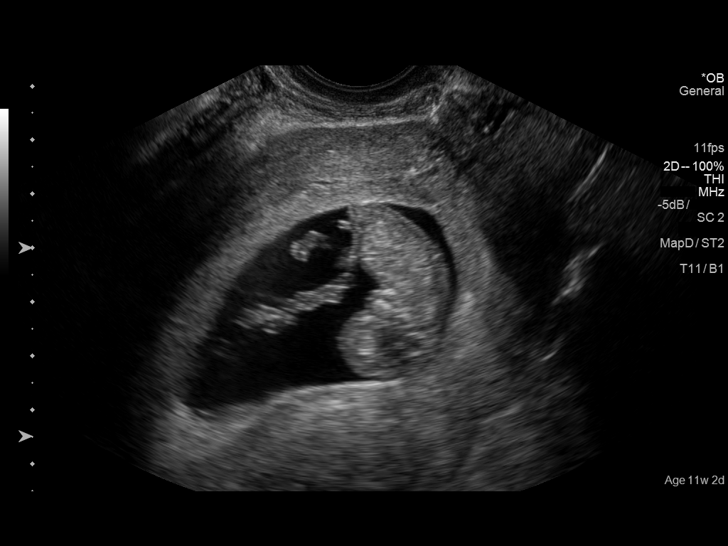
[im 30/54]
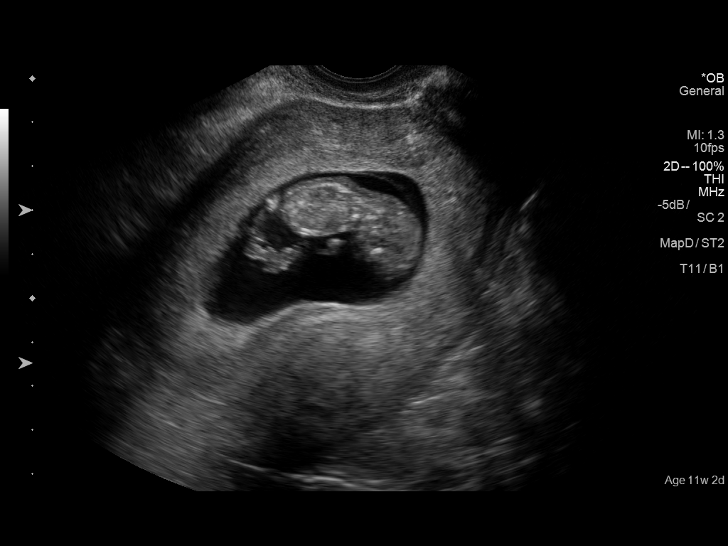
[im 34/54]
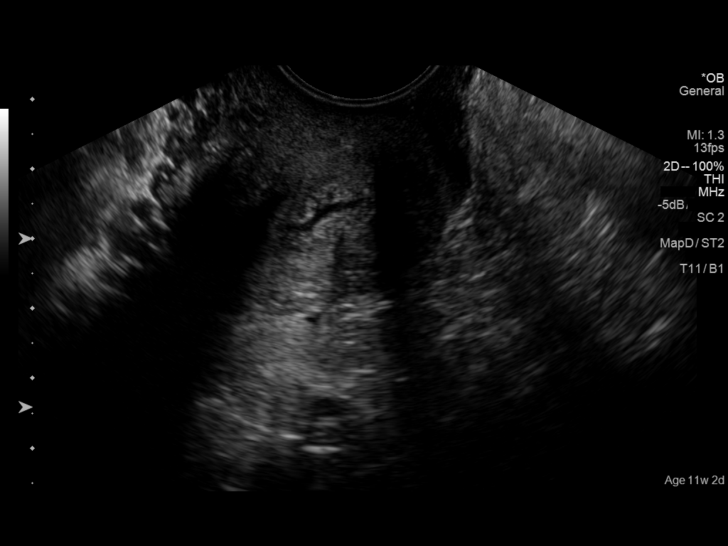
[im 38/54]
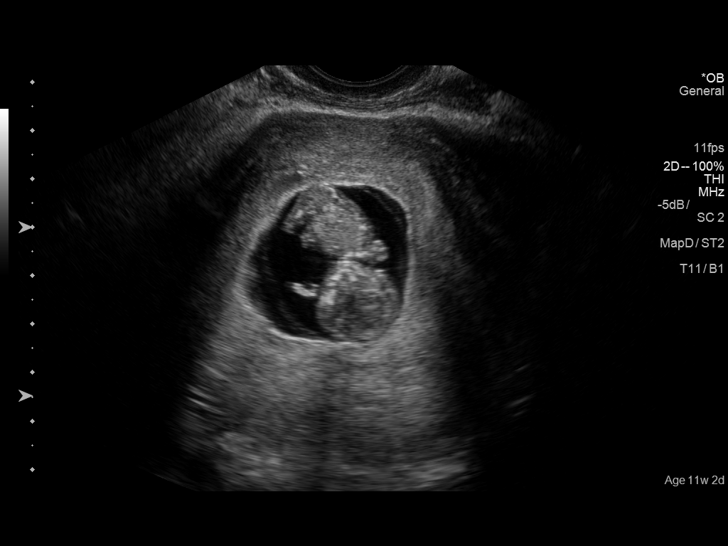
[im 42/54]
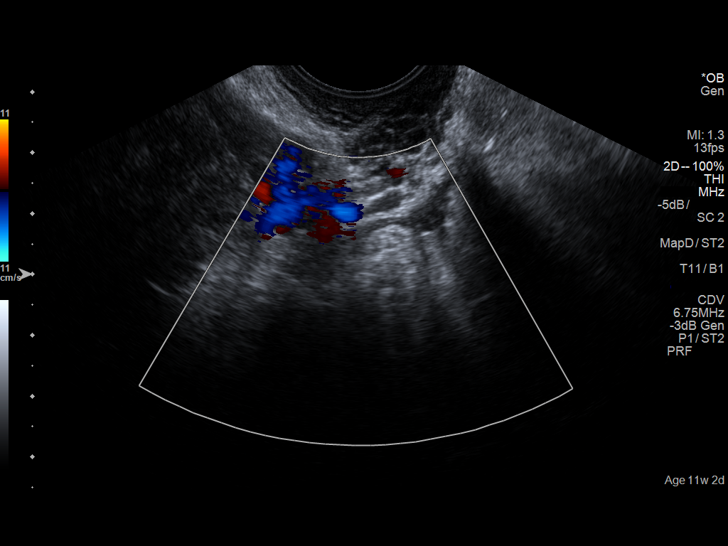
[im 46/54]
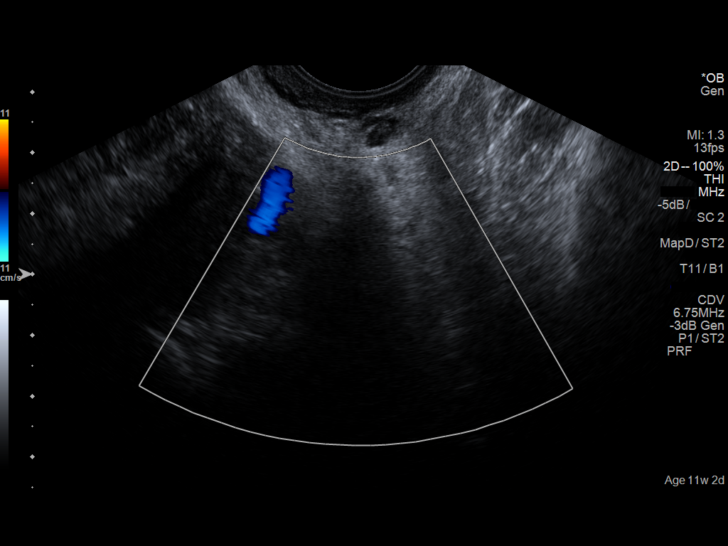
[im 50/54]
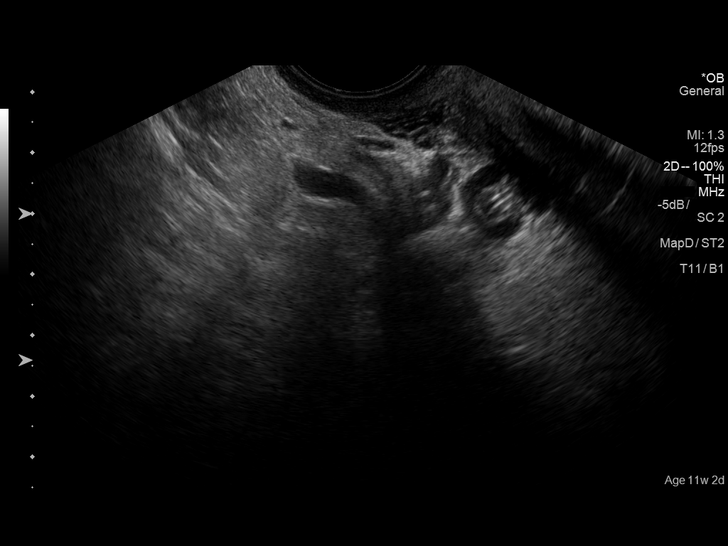
[im 54/54]
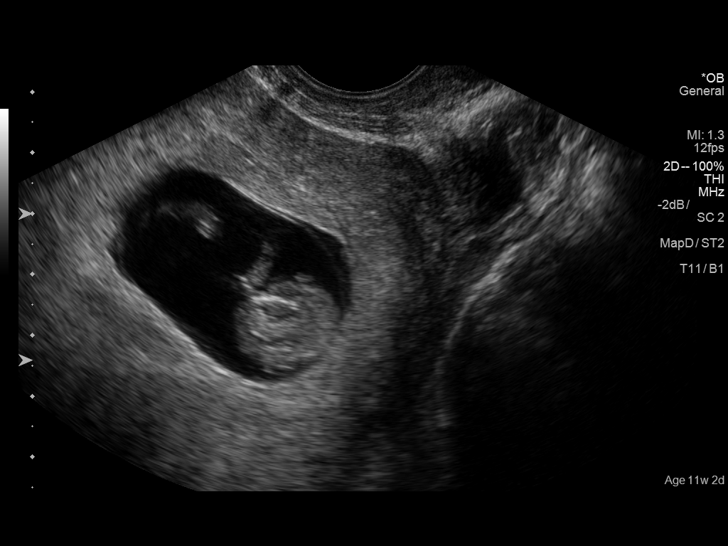

[14 of 28 positions shown; findings below may reference images not displayed]

FINDINGS: Intrauterine gestational sac: Single

Yolk sac:  Yes

Embryo:  Yes

Cardiac Activity: Yes

Heart Rate: 158  bpm

MSD:   mm    w     d

CRL:  32.2  mm   10 w   1 d                  US EDC: 02/19/2016

Subchorionic hemorrhage:  None visualized.

Maternal uterus/adnexae: No abnormal fluid collections. Ovaries were
not visible.
IMPRESSION: Single living intrauterine gestation measuring 10 weeks 1 day by
crown-rump length.

## 2017-09-22 ENCOUNTER — Other Ambulatory Visit: Payer: Self-pay

## 2017-09-22 ENCOUNTER — Emergency Department
Admission: EM | Admit: 2017-09-22 | Discharge: 2017-09-23 | Disposition: A | Discharge status: Home / Self Care | Payer: Self-pay | Attending: Emergency Medicine | Admitting: Emergency Medicine

## 2017-09-22 ENCOUNTER — Encounter: Payer: Self-pay | Admitting: Emergency Medicine

## 2017-09-22 DIAGNOSIS — R102 Pelvic and perineal pain: Secondary | ICD-10-CM | POA: Insufficient documentation

## 2017-09-22 DIAGNOSIS — F17210 Nicotine dependence, cigarettes, uncomplicated: Secondary | ICD-10-CM | POA: Insufficient documentation

## 2017-09-22 LAB — URINALYSIS, COMPLETE (UACMP) WITH MICROSCOPIC
Bacteria, UA: NONE SEEN
Bilirubin Urine: NEGATIVE
GLUCOSE, UA: NEGATIVE mg/dL
Ketones, ur: NEGATIVE mg/dL
LEUKOCYTES UA: NEGATIVE
Nitrite: NEGATIVE
PH: 5 (ref 5.0–8.0)
Protein, ur: NEGATIVE mg/dL
Specific Gravity, Urine: 1.029 (ref 1.005–1.030)

## 2017-09-22 LAB — COMPREHENSIVE METABOLIC PANEL
ALK PHOS: 73 U/L (ref 38–126)
ALT: 34 U/L (ref 0–44)
AST: 26 U/L (ref 15–41)
Albumin: 4.4 g/dL (ref 3.5–5.0)
Anion gap: 7 (ref 5–15)
BUN: 11 mg/dL (ref 6–20)
CALCIUM: 9.2 mg/dL (ref 8.9–10.3)
CO2: 25 mmol/L (ref 22–32)
CREATININE: 0.71 mg/dL (ref 0.44–1.00)
Chloride: 108 mmol/L (ref 98–111)
GFR calc Af Amer: >60 mL/min (ref >60)
Glucose, Bld: 87 mg/dL (ref 70–99)
Potassium: 3.5 mmol/L (ref 3.5–5.1)
Sodium: 140 mmol/L (ref 135–145)
Total Bilirubin: 0.5 mg/dL (ref 0.3–1.2)
Total Protein: 8.2 g/dL — ABNORMAL HIGH (ref 6.5–8.1)

## 2017-09-22 LAB — CBC
HCT: 40.6 % (ref 35.0–47.0)
Hemoglobin: 14.3 g/dL (ref 12.0–16.0)
MCH: 28.8 pg (ref 26.0–34.0)
MCHC: 35.1 g/dL (ref 32.0–36.0)
MCV: 82.1 fL (ref 80.0–100.0)
PLATELETS: 301 10*3/uL (ref 150–440)
RBC: 4.95 MIL/uL (ref 3.80–5.20)
RDW: 15.4 % — ABNORMAL HIGH (ref 11.5–14.5)
WBC: 10.1 10*3/uL (ref 3.6–11.0)

## 2017-09-22 LAB — POCT PREGNANCY, URINE: Preg Test, Ur: NEGATIVE

## 2017-09-22 LAB — LIPASE, BLOOD: Lipase: 29 U/L (ref 11–51)
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
## 2017-09-22 NOTE — ED Triage Notes (Signed)
Patient ambulatory to triage with steady gait, without difficulty or distress noted; pt reports x 2wks has had intermittent lower abd cramping accomp by nausea; denies hx of same

## 2017-09-23 ENCOUNTER — Emergency Department: Payer: Self-pay

## 2017-09-23 NOTE — ED Notes (Signed)
09/23/2017  12n--patient called asking for results of ultrasound.  Says she did not get to talke to the doctor before she was discharged and she wanted to know  If was a cyst.  I reviewed the ultrasound and explained the there was not a cyst seen.  She says she has this pain for a while now.  I asked her to call pcp to tell them that she continues to have this pain.  I told her to tell them that the ultrasound was done already, then they can go from there for follow up.  She agrees.

## 2018-02-15 IMAGING — US US OB FOLLOW-UP
1 series · 13 of 28 positions shown · non-contrast
Comparison: none

PATIENT INFO:

CLAUDIO
PERFORMED BY:
WHNP
SERVICE(S) PROVIDED:
US OB FOLLOW UP                                       76816.1
INDICATIONS:
36 weeks gestation of pregnancy
Obesity
Hx endometriosis
FETAL EVALUATION:
Num Of Fetuses:     1
Fetal Heart         158
Rate(bpm):
Fetal Lie:          Posterior
Presentation:       Cephalic
Placenta:           Anterior Grade 3, No previa
AFI Sum(cm)     %Tile       Largest Pocket(cm)
8.94            16
RUQ(cm)       RLQ(cm)       LUQ(cm)        LLQ(cm)
3.55
BIOMETRY:
BPD:        91  mm     G. Age:  36w 6d         66  %    CI:        76.92   %    70 - 86
FL/HC:       21.7  %    20.8 -
HC:      328.6  mm     G. Age:  37w 3d         35  %    HC/AC:       0.99       0.92 -
AC:      333.1  mm     G. Age:  37w 1d         73  %    FL/BPD:      78.2  %    71 - 87
FL:       71.2  mm     G. Age:  36w 4d         39  %    FL/AC:       21.4  %    20 - 24
HUM:      61.3  mm     G. Age:  35w 3d         44  %
Est. FW:    7557   gm   6 lb 13 oz      58  %
GESTATIONAL AGE:
LMP:           38w 0d        Date:  05/07/15                 EDD:   02/11/16
U/S Today:     37w 0d                                        EDD:   02/18/16
Best:          36w 6d     Det. By:  Early Ultrasound         EDD:   02/19/16
(07/25/15)
ANATOMY:
Cavum:                 Visualized             Stomach:                Seen
previously
Ventricles:            Normal appearance      Abdominal Wall:         Visualized
Cerebellum:            Visualized             Cord Vessels:           3 vessels,
previously                                     visualized previously
Posterior Fossa:       Visualized             Kidneys:                Normal appearance
Face:                  Orbits visualized      Bladder:                Seen
Lips:                  Visualized             Spine:                  Visualized
previously                                     previously
Heart:                 4-Chamber view         Upper Extremities:      Visualized
appears normal                                 previously
RVOT:                  Normal appearance      Lower Extremities:      Visualized
LVOT:                  Normal appearance
CERVIX UTERUS ADNEXA:
Cervix
Length:           3.02  cm.

[Series 1: us ob follow-up · 0.30mm/px · 13 of 56 slices shown]
[im 3/56]
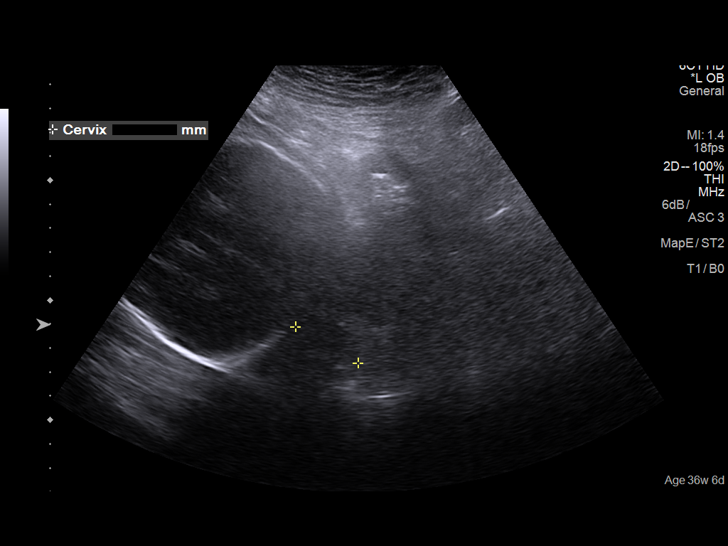
[im 7/56]
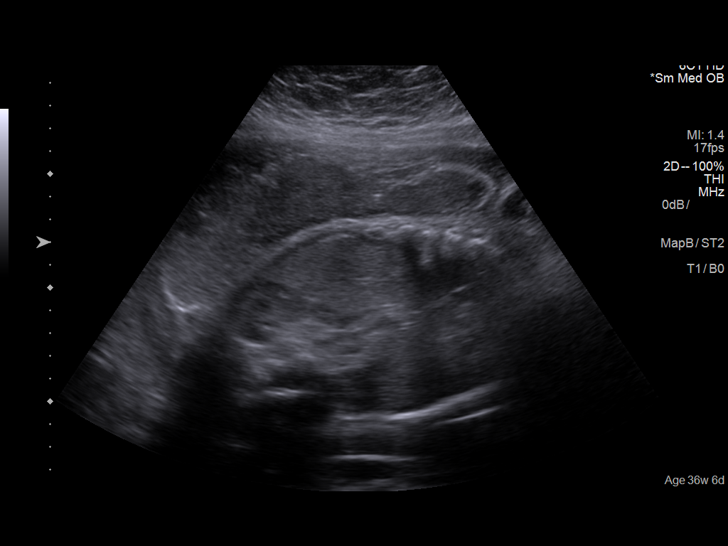
[im 11/56]
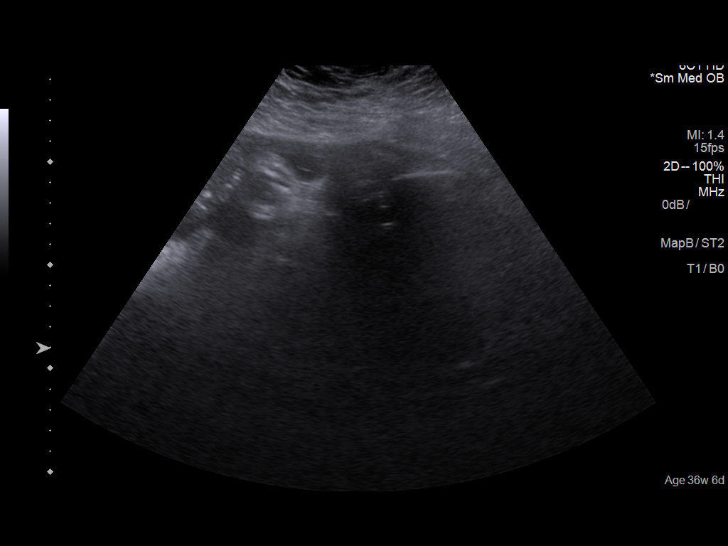
[im 15/56]
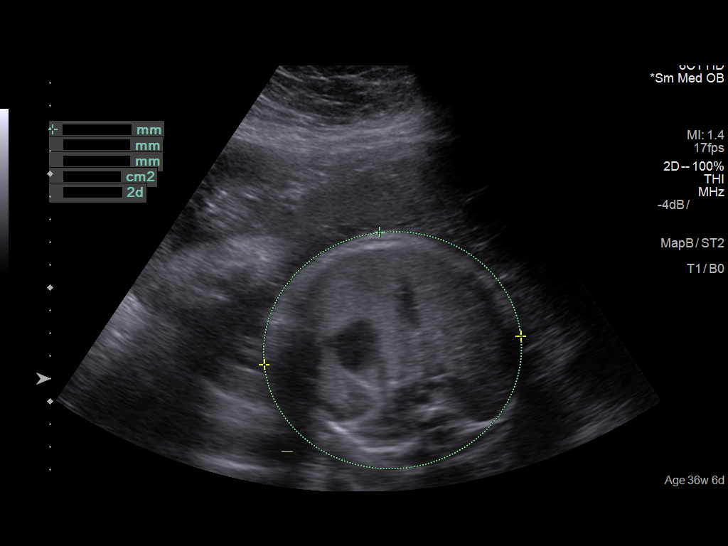
[im 19/56]
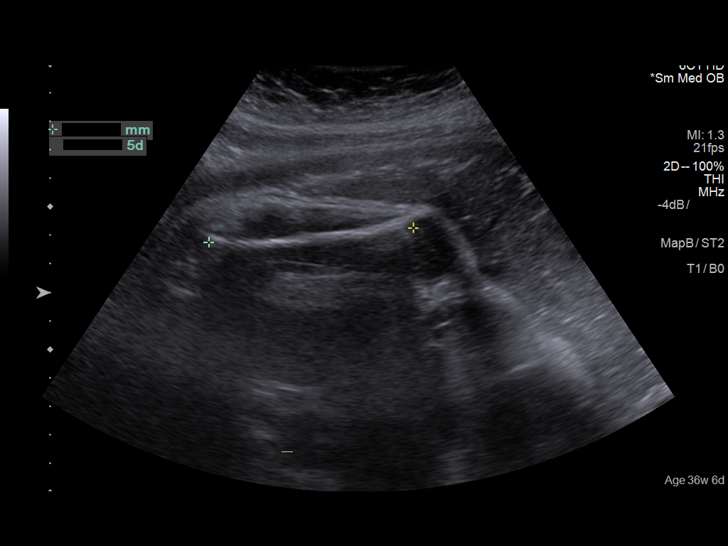
[im 23/56]
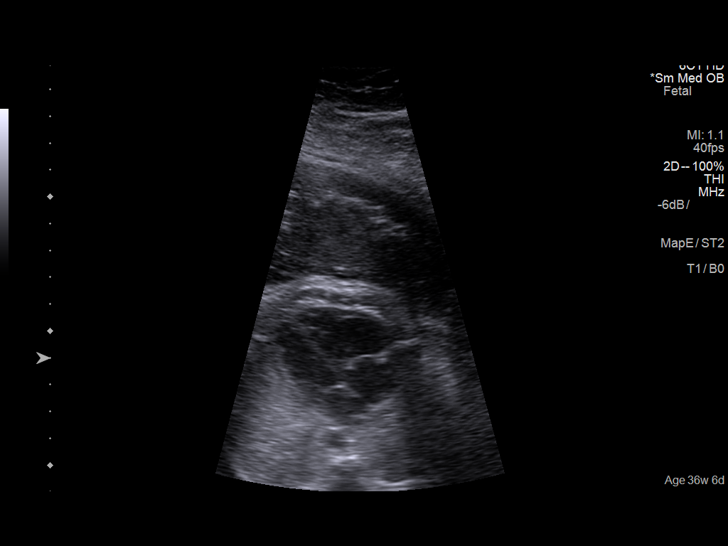
[im 29/56]
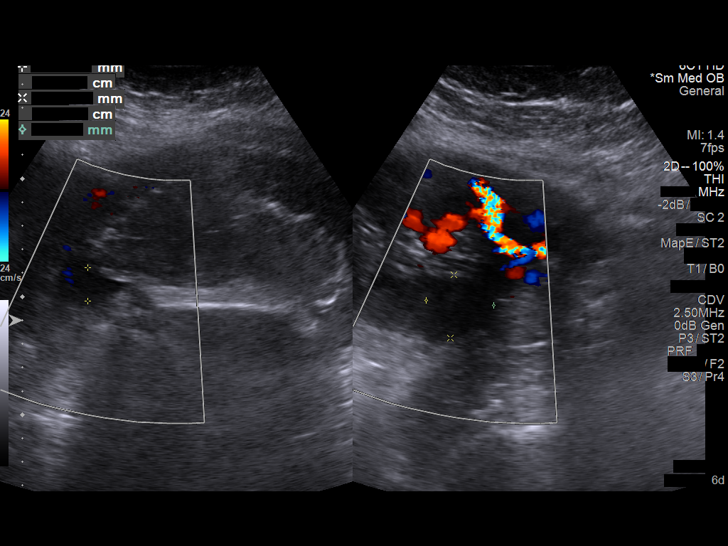
[im 33/56]
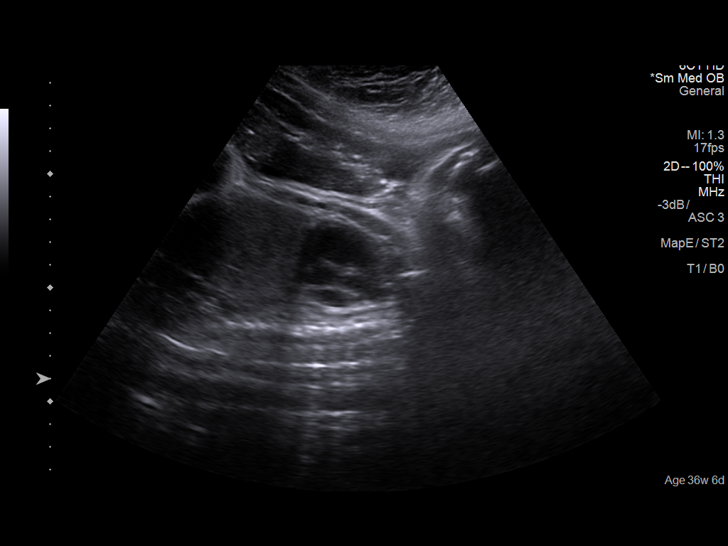
[im 37/56]
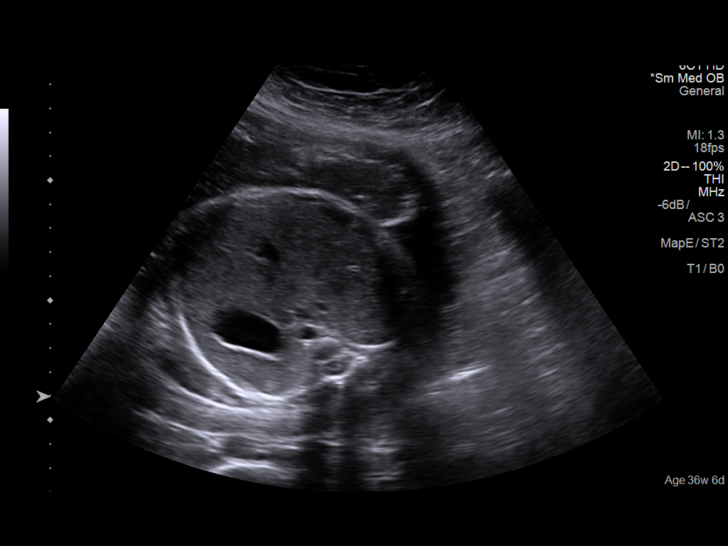
[im 41/56]
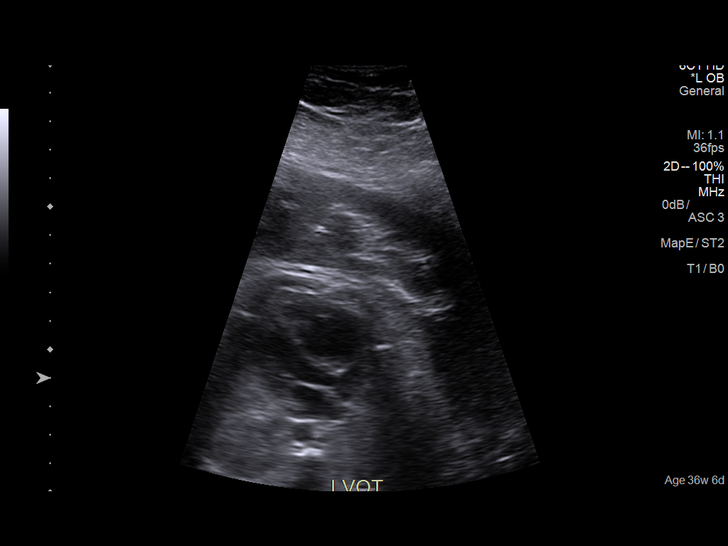
[im 45/56]
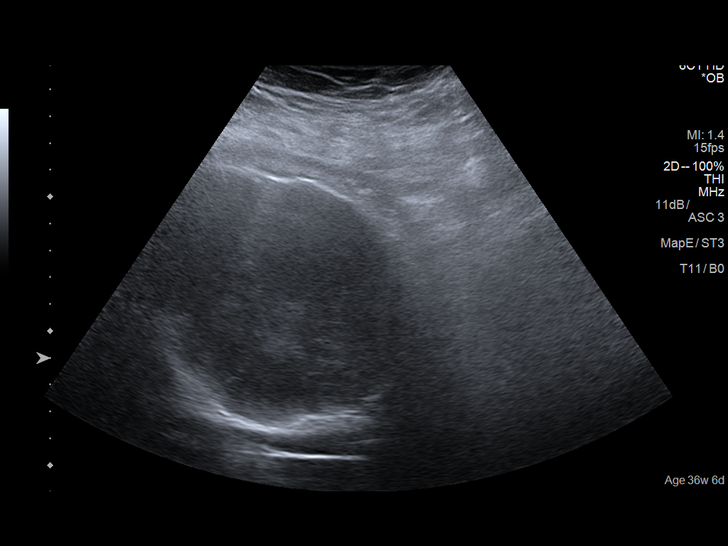
[im 49/56]
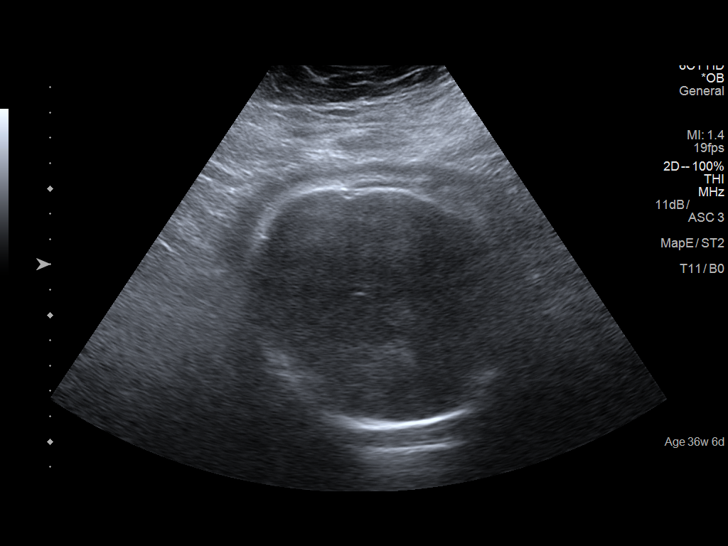
[im 53/56]
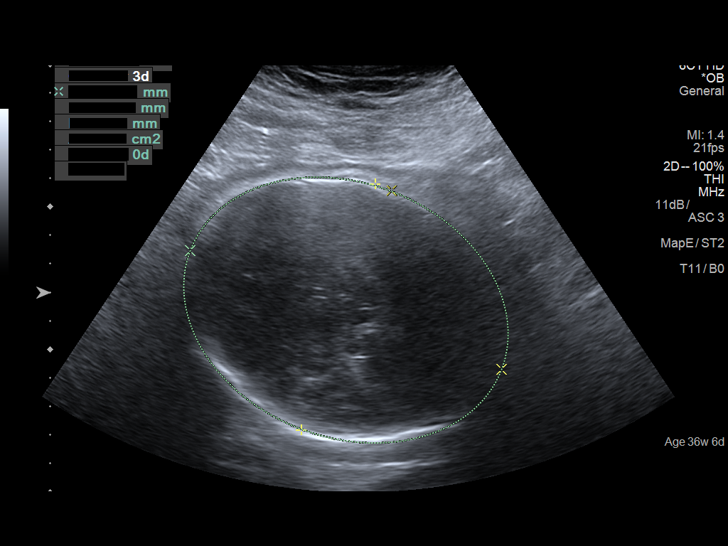

[13 of 28 positions shown; findings below may reference images not displayed]

IMPRESSION: Dear Dr. LUNDEEN,

Thank you for referring your patient to [facility] for a
fetal growth evaluation.

There is a singleton gestation with normal amniotic fluid
volume.

The fetal biometry correlates with established dating.
Adequate interval growth is noted.
The estimated fetal weight is at the  58    percentile.

Thank you for allowing us to participate in your patient's care.
assistance.

## 2019-05-09 ENCOUNTER — Other Ambulatory Visit: Payer: Self-pay

## 2019-05-09 ENCOUNTER — Emergency Department
Admission: EM | Admit: 2019-05-09 | Discharge: 2019-05-09 | Disposition: A | Discharge status: Home / Self Care | Payer: Medicaid Other | Attending: Emergency Medicine | Admitting: Emergency Medicine

## 2019-05-09 ENCOUNTER — Emergency Department: Payer: Medicaid Other

## 2019-05-09 ENCOUNTER — Encounter: Payer: Self-pay | Admitting: Emergency Medicine

## 2019-05-09 DIAGNOSIS — F17210 Nicotine dependence, cigarettes, uncomplicated: Secondary | ICD-10-CM | POA: Diagnosis not present

## 2019-05-09 DIAGNOSIS — L84 Corns and callosities: Secondary | ICD-10-CM | POA: Diagnosis not present

## 2019-05-09 DIAGNOSIS — Z1881 Retained glass fragments: Secondary | ICD-10-CM | POA: Diagnosis not present

## 2019-05-09 DIAGNOSIS — M79671 Pain in right foot: Secondary | ICD-10-CM | POA: Diagnosis present

## 2019-05-09 DIAGNOSIS — M795 Residual foreign body in soft tissue: Secondary | ICD-10-CM

## 2019-05-09 NOTE — ED Triage Notes (Signed)
Pt reports has glass stuck in her right foot that has been there a month. Pt states has been soaking in saltwater but is not coming out.

## 2019-05-09 NOTE — Discharge Instructions (Signed)
Please call and schedule follow-up appointment with podiatry. You may want to purchase a cushion pad to wear for comfort.
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
## 2019-10-10 ENCOUNTER — Emergency Department (HOSPITAL_COMMUNITY)
Admission: EM | Admit: 2019-10-10 | Discharge: 2019-10-11 | Disposition: A | Discharge status: Home / Self Care | Payer: Medicaid Other | Attending: Emergency Medicine | Admitting: Emergency Medicine

## 2019-10-10 ENCOUNTER — Other Ambulatory Visit: Payer: Self-pay

## 2019-10-10 DIAGNOSIS — R519 Headache, unspecified: Secondary | ICD-10-CM | POA: Diagnosis not present

## 2019-10-10 DIAGNOSIS — Z5321 Procedure and treatment not carried out due to patient leaving prior to being seen by health care provider: Secondary | ICD-10-CM | POA: Diagnosis not present

## 2019-10-10 DIAGNOSIS — R110 Nausea: Secondary | ICD-10-CM | POA: Insufficient documentation

## 2019-10-10 DIAGNOSIS — R1013 Epigastric pain: Secondary | ICD-10-CM | POA: Insufficient documentation

## 2019-10-10 LAB — CBC
HCT: 44.2 % (ref 36.0–46.0)
Hemoglobin: 14.3 g/dL (ref 12.0–15.0)
MCH: 27.8 pg (ref 26.0–34.0)
MCHC: 32.4 g/dL (ref 30.0–36.0)
MCV: 86 fL (ref 80.0–100.0)
Platelets: 285 10*3/uL (ref 150–400)
RBC: 5.14 MIL/uL — ABNORMAL HIGH (ref 3.87–5.11)
RDW: 14.1 % (ref 11.5–15.5)
WBC: 6 10*3/uL (ref 4.0–10.5)
nRBC: 0 % (ref 0.0–0.2)

## 2019-10-10 LAB — I-STAT BETA HCG BLOOD, ED (MC, WL, AP ONLY): I-stat hCG, quantitative: <5 m[IU]/mL (ref <5)

## 2019-10-10 LAB — URINALYSIS, ROUTINE W REFLEX MICROSCOPIC
Bilirubin Urine: NEGATIVE
Glucose, UA: NEGATIVE mg/dL
Ketones, ur: NEGATIVE mg/dL
Leukocytes,Ua: NEGATIVE
Nitrite: POSITIVE — AB
Protein, ur: 30 mg/dL — AB
Specific Gravity, Urine: 1.028 (ref 1.005–1.030)
pH: 5 (ref 5.0–8.0)

## 2019-10-10 LAB — COMPREHENSIVE METABOLIC PANEL
ALT: 39 U/L (ref 0–44)
AST: 39 U/L (ref 15–41)
Albumin: 3.5 g/dL (ref 3.5–5.0)
Alkaline Phosphatase: 67 U/L (ref 38–126)
Anion gap: 9 (ref 5–15)
BUN: 8 mg/dL (ref 6–20)
CO2: 21 mmol/L — ABNORMAL LOW (ref 22–32)
Calcium: 8.8 mg/dL — ABNORMAL LOW (ref 8.9–10.3)
Chloride: 105 mmol/L (ref 98–111)
Creatinine, Ser: 0.76 mg/dL (ref 0.44–1.00)
GFR calc Af Amer: >60 mL/min (ref >60)
GFR calc non Af Amer: >60 mL/min (ref >60)
Glucose, Bld: 116 mg/dL — ABNORMAL HIGH (ref 70–99)
Potassium: 3.5 mmol/L (ref 3.5–5.1)
Sodium: 135 mmol/L (ref 135–145)
Total Bilirubin: 0.5 mg/dL (ref 0.3–1.2)
Total Protein: 7.6 g/dL (ref 6.5–8.1)

## 2019-10-10 LAB — LIPASE, BLOOD: Lipase: 43 U/L (ref 11–51)

## 2019-10-10 MED ORDER — SODIUM CHLORIDE 0.9% FLUSH: 3.0000 mL | Freq: Once | INTRAVENOUS | Status: DC

## 2019-10-10 NOTE — ED Triage Notes (Signed)
Pt here with reports of headache since Saturday along with nausea and epigastric pain after eating.

## 2019-11-07 ENCOUNTER — Encounter: Payer: Self-pay | Admitting: Emergency Medicine

## 2019-11-07 ENCOUNTER — Emergency Department: Payer: No Typology Code available for payment source

## 2019-11-07 ENCOUNTER — Emergency Department
Admission: EM | Admit: 2019-11-07 | Discharge: 2019-11-07 | Disposition: A | Discharge status: Home / Self Care | Payer: No Typology Code available for payment source | Attending: Student in an Organized Health Care Education/Training Program | Admitting: Student in an Organized Health Care Education/Training Program

## 2019-11-07 DIAGNOSIS — T1490XA Injury, unspecified, initial encounter: Secondary | ICD-10-CM

## 2019-11-07 DIAGNOSIS — M79646 Pain in unspecified finger(s): Secondary | ICD-10-CM | POA: Insufficient documentation

## 2019-11-07 DIAGNOSIS — Y92410 Unspecified street and highway as the place of occurrence of the external cause: Secondary | ICD-10-CM | POA: Diagnosis not present

## 2019-11-07 DIAGNOSIS — Y999 Unspecified external cause status: Secondary | ICD-10-CM | POA: Diagnosis not present

## 2019-11-07 DIAGNOSIS — F17210 Nicotine dependence, cigarettes, uncomplicated: Secondary | ICD-10-CM | POA: Diagnosis not present

## 2019-11-07 DIAGNOSIS — G8911 Acute pain due to trauma: Secondary | ICD-10-CM | POA: Diagnosis not present

## 2019-11-07 DIAGNOSIS — R519 Headache, unspecified: Secondary | ICD-10-CM | POA: Insufficient documentation

## 2019-11-07 DIAGNOSIS — R109 Unspecified abdominal pain: Secondary | ICD-10-CM | POA: Insufficient documentation

## 2019-11-07 DIAGNOSIS — Y939 Activity, unspecified: Secondary | ICD-10-CM | POA: Diagnosis not present

## 2019-11-07 DIAGNOSIS — R911 Solitary pulmonary nodule: Secondary | ICD-10-CM

## 2019-11-07 LAB — CBC
HCT: 41.6 % (ref 36.0–46.0)
Hemoglobin: 13.8 g/dL (ref 12.0–15.0)
MCH: 28.3 pg (ref 26.0–34.0)
MCHC: 33.2 g/dL (ref 30.0–36.0)
MCV: 85.4 fL (ref 80.0–100.0)
Platelets: 349 10*3/uL (ref 150–400)
RBC: 4.87 MIL/uL (ref 3.87–5.11)
RDW: 14.6 % (ref 11.5–15.5)
WBC: 10.7 10*3/uL — ABNORMAL HIGH (ref 4.0–10.5)
nRBC: 0 % (ref 0.0–0.2)

## 2019-11-07 LAB — BASIC METABOLIC PANEL
Anion gap: 9 (ref 5–15)
BUN: 9 mg/dL (ref 6–20)
CO2: 21 mmol/L — ABNORMAL LOW (ref 22–32)
Calcium: 8.8 mg/dL — ABNORMAL LOW (ref 8.9–10.3)
Chloride: 109 mmol/L (ref 98–111)
Creatinine, Ser: 0.75 mg/dL (ref 0.44–1.00)
GFR calc Af Amer: >60 mL/min (ref >60)
GFR calc non Af Amer: >60 mL/min (ref >60)
Glucose, Bld: 129 mg/dL — ABNORMAL HIGH (ref 70–99)
Potassium: 3.9 mmol/L (ref 3.5–5.1)
Sodium: 139 mmol/L (ref 135–145)

## 2019-11-07 MED ORDER — IOHEXOL 300 MG/ML  SOLN
125.0000 mL | Freq: Once | INTRAMUSCULAR | Status: AC | PRN
  Administered 2019-11-07: 125 mL via INTRAVENOUS
  Filled 2019-11-07: qty 125

## 2019-11-07 MED ORDER — BACLOFEN 5 MG PO TABS
5.0000 mg | ORAL_TABLET | Freq: Three times a day (TID) | ORAL | 0 refills | Status: DC | PRN
Start: 2019-11-07 — End: 2020-02-13
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
## 2019-11-10 ENCOUNTER — Other Ambulatory Visit: Payer: Self-pay

## 2019-11-10 DIAGNOSIS — K0889 Other specified disorders of teeth and supporting structures: Secondary | ICD-10-CM | POA: Diagnosis not present

## 2019-11-10 DIAGNOSIS — Z5321 Procedure and treatment not carried out due to patient leaving prior to being seen by health care provider: Secondary | ICD-10-CM | POA: Diagnosis not present

## 2019-11-10 NOTE — ED Triage Notes (Signed)
Patient c/o upper left dental pain X 3 days

## 2019-11-11 ENCOUNTER — Emergency Department
Admission: EM | Admit: 2019-11-11 | Discharge: 2019-11-11 | Disposition: A | Discharge status: Home / Self Care | Payer: Medicaid Other | Attending: Emergency Medicine | Admitting: Emergency Medicine

## 2019-11-11 NOTE — ED Notes (Signed)
No answer when called several times from lobby 

## 2019-12-26 ENCOUNTER — Emergency Department (HOSPITAL_COMMUNITY)
Admission: EM | Admit: 2019-12-26 | Discharge: 2019-12-26 | Disposition: A | Discharge status: Home / Self Care | Payer: Medicaid Other | Attending: Emergency Medicine | Admitting: Emergency Medicine

## 2019-12-26 ENCOUNTER — Encounter (HOSPITAL_COMMUNITY): Payer: Self-pay | Admitting: Emergency Medicine

## 2019-12-26 DIAGNOSIS — F17210 Nicotine dependence, cigarettes, uncomplicated: Secondary | ICD-10-CM | POA: Diagnosis not present

## 2019-12-26 DIAGNOSIS — L231 Allergic contact dermatitis due to adhesives: Secondary | ICD-10-CM | POA: Diagnosis not present

## 2019-12-26 DIAGNOSIS — T7840XA Allergy, unspecified, initial encounter: Secondary | ICD-10-CM | POA: Diagnosis present

## 2019-12-26 MED ORDER — PREDNISONE 50 MG PO TABS
50.0000 mg | ORAL_TABLET | Freq: Every day | ORAL | 0 refills | Status: DC
Start: 2019-12-26 — End: 2020-02-13

## 2019-12-26 MED ORDER — LORATADINE 10 MG PO TABS
10.0000 mg | ORAL_TABLET | Freq: Every day | ORAL | 0 refills | Status: DC
Start: 2019-12-26 — End: 2020-02-13

## 2019-12-26 NOTE — ED Triage Notes (Signed)
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 Unsure if she is having a reaction to the glue she was working with because she did not have gloves on but has worked with this glue before.  Pt denies and oral swelling, no sob or CP. Airway intact, no distress noted. No change in vision.

## 2019-12-26 NOTE — ED Notes (Signed)
Patient verbalizes understanding of discharge instructions. Opportunity for questioning and answers were provided. Arm band removed by staff, patient discharged from ED. 
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
## 2019-12-26 NOTE — Discharge Instructions (Signed)
Avoid contact with the chemical from last night, take the mediations as prescribed, return to the ED for difficulty breathing, worsening symptoms

## 2020-01-17 ENCOUNTER — Ambulatory Visit (LOCAL_COMMUNITY_HEALTH_CENTER): Payer: Medicaid Other

## 2020-01-17 ENCOUNTER — Other Ambulatory Visit: Payer: Self-pay

## 2020-01-17 VITALS — BP 122/70 | Ht 71.0 in | Wt 303.0 lb

## 2020-01-17 DIAGNOSIS — Z3202 Encounter for pregnancy test, result negative: Secondary | ICD-10-CM

## 2020-01-17 LAB — PREGNANCY, URINE: Preg Test, Ur: NEGATIVE

## 2020-01-17 NOTE — Progress Notes (Signed)
UPT negative today. Hx BTL 2017. LMP 10/30/2019. Reports nausea x 1 mo. Advised to f- u with PCP Phineas Real) to evaluate missed periods. 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 Jerel Shepherd, RN

## 2020-02-01 ENCOUNTER — Other Ambulatory Visit: Payer: Self-pay

## 2020-02-01 ENCOUNTER — Emergency Department
Admission: EM | Admit: 2020-02-01 | Discharge: 2020-02-01 | Disposition: A | Discharge status: Home / Self Care | Payer: Medicaid Other | Attending: Emergency Medicine | Admitting: Emergency Medicine

## 2020-02-01 DIAGNOSIS — M545 Low back pain, unspecified: Secondary | ICD-10-CM | POA: Diagnosis not present

## 2020-02-01 DIAGNOSIS — R739 Hyperglycemia, unspecified: Secondary | ICD-10-CM | POA: Insufficient documentation

## 2020-02-01 DIAGNOSIS — R109 Unspecified abdominal pain: Secondary | ICD-10-CM | POA: Diagnosis not present

## 2020-02-01 DIAGNOSIS — Z87891 Personal history of nicotine dependence: Secondary | ICD-10-CM | POA: Diagnosis not present

## 2020-02-01 DIAGNOSIS — M549 Dorsalgia, unspecified: Secondary | ICD-10-CM | POA: Diagnosis present

## 2020-02-01 DIAGNOSIS — R350 Frequency of micturition: Secondary | ICD-10-CM | POA: Diagnosis not present

## 2020-02-01 LAB — URINALYSIS, COMPLETE (UACMP) WITH MICROSCOPIC
Bilirubin Urine: NEGATIVE
Glucose, UA: 50 mg/dL — AB
Hgb urine dipstick: NEGATIVE
Ketones, ur: NEGATIVE mg/dL
Nitrite: NEGATIVE
Protein, ur: NEGATIVE mg/dL
Specific Gravity, Urine: 1.027 (ref 1.005–1.030)
pH: 6 (ref 5.0–8.0)

## 2020-02-01 LAB — CBC
HCT: 40.8 % (ref 36.0–46.0)
Hemoglobin: 13.5 g/dL (ref 12.0–15.0)
MCH: 28.1 pg (ref 26.0–34.0)
MCHC: 33.1 g/dL (ref 30.0–36.0)
MCV: 85 fL (ref 80.0–100.0)
Platelets: 321 10*3/uL (ref 150–400)
RBC: 4.8 MIL/uL (ref 3.87–5.11)
RDW: 14.4 % (ref 11.5–15.5)
WBC: 9.4 10*3/uL (ref 4.0–10.5)
nRBC: 0 % (ref 0.0–0.2)

## 2020-02-01 LAB — BASIC METABOLIC PANEL
Anion gap: 11 (ref 5–15)
BUN: 11 mg/dL (ref 6–20)
CO2: 21 mmol/L — ABNORMAL LOW (ref 22–32)
Calcium: 9.2 mg/dL (ref 8.9–10.3)
Chloride: 101 mmol/L (ref 98–111)
Creatinine, Ser: 0.67 mg/dL (ref 0.44–1.00)
GFR, Estimated: >60 mL/min (ref >60)
Glucose, Bld: 258 mg/dL — ABNORMAL HIGH (ref 70–99)
Potassium: 3.7 mmol/L (ref 3.5–5.1)
Sodium: 133 mmol/L — ABNORMAL LOW (ref 135–145)

## 2020-02-01 LAB — POC URINE PREG, ED: Preg Test, Ur: NEGATIVE

## 2020-02-01 MED ORDER — LIDOCAINE 5 % EX PTCH
1.0000 | MEDICATED_PATCH | Freq: Two times a day (BID) | CUTANEOUS | 0 refills | Status: DC
Start: 2020-02-01 — End: 2020-02-13

## 2020-02-01 MED ORDER — LIDOCAINE 5 % EX PTCH
1.0000 | MEDICATED_PATCH | CUTANEOUS | Status: DC
  Administered 2020-02-01: 1 via TRANSDERMAL
  Filled 2020-02-01: qty 1

## 2020-02-01 MED ORDER — KETOROLAC TROMETHAMINE 60 MG/2ML IM SOLN
30.0000 mg | Freq: Once | INTRAMUSCULAR | Status: AC
  Administered 2020-02-01: 30 mg via INTRAMUSCULAR
  Filled 2020-02-01: qty 2

## 2020-02-01 MED ORDER — CYCLOBENZAPRINE HCL 5 MG PO TABS: 5.0000 mg | ORAL_TABLET | Freq: Three times a day (TID) | ORAL | 0 refills | Status: DC | PRN

## 2020-02-01 NOTE — ED Triage Notes (Signed)
Pt comes into the ED via POV c/o possible UTI with increased urine frequency.  Pt admits to having lower abdominal discomfort and back pain associated with it as well.  Pt currently afebrile with stable VSS and in NAD.
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
## 2020-02-03 ENCOUNTER — Ambulatory Visit (INDEPENDENT_AMBULATORY_CARE_PROVIDER_SITE_OTHER): Payer: Medicaid Other | Admitting: Obstetrics and Gynecology

## 2020-02-03 ENCOUNTER — Encounter: Payer: Self-pay | Admitting: Obstetrics and Gynecology

## 2020-02-03 ENCOUNTER — Other Ambulatory Visit: Payer: Self-pay

## 2020-02-03 ENCOUNTER — Other Ambulatory Visit (HOSPITAL_COMMUNITY)
Admission: RE | Admit: 2020-02-03 | Discharge: 2020-02-03 | Disposition: A | Discharge status: Home / Self Care | Payer: Medicaid Other | Source: Ambulatory Visit | Attending: Obstetrics and Gynecology | Admitting: Obstetrics and Gynecology

## 2020-02-03 VITALS — BP 120/70 | Ht 71.0 in | Wt 305.2 lb

## 2020-02-03 DIAGNOSIS — R102 Pelvic and perineal pain: Secondary | ICD-10-CM

## 2020-02-03 DIAGNOSIS — M545 Low back pain, unspecified: Secondary | ICD-10-CM | POA: Diagnosis not present

## 2020-02-03 DIAGNOSIS — N912 Amenorrhea, unspecified: Secondary | ICD-10-CM | POA: Diagnosis not present

## 2020-02-03 DIAGNOSIS — Z23 Encounter for immunization: Secondary | ICD-10-CM | POA: Diagnosis not present

## 2020-02-03 DIAGNOSIS — R300 Dysuria: Secondary | ICD-10-CM

## 2020-02-03 DIAGNOSIS — Z124 Encounter for screening for malignant neoplasm of cervix: Secondary | ICD-10-CM

## 2020-02-03 NOTE — Progress Notes (Signed)
Pt  Follow up from the ER back pain. LMP 10/18/2019.

## 2020-02-03 NOTE — Patient Instructions (Addendum)
Exercising to Stay Healthy To become healthy and stay healthy, it is recommended that you do moderate-intensity and vigorous-intensity exercise. You can tell that you are exercising at a moderate intensity if your heart starts beating faster and you start breathing faster but can still hold a conversation. You can tell that you are exercising at a vigorous intensity if you are breathing much harder and faster and cannot hold a conversation while exercising. Exercising regularly is important. It has many health benefits, such as:  Improving overall fitness, flexibility, and endurance.  Increasing bone density.  Helping with weight control.  Decreasing body fat.  Increasing muscle strength.  Reducing stress and tension.  Improving overall health. How often should I exercise? Choose an activity that you enjoy, and set realistic goals. Your health care provider can help you make an activity plan that works for you. Exercise regularly as told by your health care provider. This may include:  Doing strength training two times a week, such as: ? Lifting weights. ? Using resistance bands. ? Push-ups. ? Sit-ups. ? Yoga.  Doing a certain intensity of exercise for a given amount of time. Choose from these options: ? A total of 150 minutes of moderate-intensity exercise every week. ? A total of 75 minutes of vigorous-intensity exercise every week. ? A mix of moderate-intensity and vigorous-intensity exercise every week. Children, pregnant women, people who have not exercised regularly, people who are overweight, and older adults may need to talk with a health care provider about what activities are safe to do. If you have a medical condition, be sure to talk with your health care provider before you start a new exercise program. What are some exercise ideas? Moderate-intensity exercise ideas include:  Walking 1 mile (1.6 km) in about 15  minutes.  Biking.  Hiking.  Golfing.  Dancing.  Water aerobics. Vigorous-intensity exercise ideas include:  Walking 4.5 miles (7.2 km) or more in about 1 hour.  Jogging or running 5 miles (8 km) in about 1 hour.  Biking 10 miles (16.1 km) or more in about 1 hour.  Lap swimming.  Roller-skating or in-line skating.  Cross-country skiing.  Vigorous competitive sports, such as football, basketball, and soccer.  Jumping rope.  Aerobic dancing. What are some everyday activities that can help me to get exercise?  Yard work, such as: ? Pushing a lawn mower. ? Raking and bagging leaves.  Washing your car.  Pushing a stroller.  Shoveling snow.  Gardening.  Washing windows or floors. How can I be more active in my day-to-day activities?  Use stairs instead of an elevator.  Take a walk during your lunch break.  If you drive, park your car farther away from your work or school.  If you take public transportation, get off one stop early and walk the rest of the way.  Stand up or walk around during all of your indoor phone calls.  Get up, stretch, and walk around every 30 minutes throughout the day.  Enjoy exercise with a friend. Support to continue exercising will help you keep a regular routine of activity. What guidelines can I follow while exercising?  Before you start a new exercise program, talk with your health care provider.  Do not exercise so much that you hurt yourself, feel dizzy, or get very short of breath.  Wear comfortable clothes and wear shoes with good support.  Drink plenty of water while you exercise to prevent dehydration or heat stroke.  Work out until your breathing   and your heartbeat get faster. Where to find more information  U.S. Department of Health and Human Services: [employer]ed[employer].at  Centers for Disease Control and Prevention (CDC): http://www.wolf.info/ Summary  Exercising regularly is important. It will improve your overall fitness,  flexibility, and endurance.  Regular exercise also will improve your overall health. It can help you control your weight, reduce stress, and improve your bone density.  Do not exercise so much that you hurt yourself, feel dizzy, or get very short of breath.  Before you start a new exercise program, talk with your health care provider. This information is not intended to replace advice given to you by your health care provider. Make sure you discuss any questions you have with your health care provider. Document Revised: 02/13/2017 Document Reviewed: 01/22/2017 Elsevier Patient Education  [place].   Budget-Friendly Healthy Eating There are many ways to save money at the grocery store and continue to eat healthy. You can be successful if you:  Plan meals according to your budget.  Make a grocery list and only purchase food according to your grocery list.  Prepare food yourself. What are tips for following this plan?  Reading food labels  Compare food labels between brand name foods and the store brand. Often the nutritional value is the same, but the store brand is lower cost.  Look for products that do not have added sugar, fat, or salt (sodium). These often cost the same but are healthier for you. Products may be labeled as: ? Sugar-free. ? Nonfat. ? Low-fat. ? Sodium-free. ? Low-sodium.  Look for lean ground beef labeled as at least 92% lean and 8% fat. Shopping  Buy only the items on your grocery list and go only to the areas of the store that have the items on your list.  Use coupons only for foods and brands you normally buy. Avoid buying items you wouldn't normally buy simply because they are on sale.  Check online and in newspapers for weekly deals.  Buy healthy items from the bulk bins when available, such as herbs, spices, flour, pasta, nuts, and dried fruit.  Buy fruits and vegetables that are in season. Prices are usually lower on in-season  produce.  Look at the unit price on the price tag. Use it to compare different brands and sizes to find out which item is the best deal.  Choose healthy items that are often low-cost, such as carrots, potatoes, apples, bananas, and oranges. Dried or canned beans are a low-cost protein source.  Buy in bulk and freeze extra food. Items you can buy in bulk include meats, fish, poultry, frozen fruits, and frozen vegetables.  Avoid buying "ready-to-eat" foods, such as pre-cut fruits and vegetables and pre-made salads.  If possible, shop around to discover where you can find the best prices. Consider other retailers such as dollar stores, larger [employer], local fruit and vegetable stands, and farmers markets.  Do not shop when you are hungry. If you shop while hungry, it may be hard to stick to your list and budget.  Resist impulse buying. Use your grocery list as your official plan for the week.  Buy a variety of vegetables and fruits by purchasing fresh, frozen, and canned items.  Look at the top and bottom shelves for deals. Foods at eye level (eye level of an adult or child) are usually more expensive.  Be efficient with your time when shopping. The more time you spend at the store, the more money you  are likely to spend.  To save money when choosing more expensive foods like meats and dairy: ? Choose cheaper cuts of meat, such as bone-in chicken thighs and drumsticks instead of skinless and boneless chicken. When you are ready to prepare the chicken, you can remove the skin yourself to make it healthier. ? Choose lean meats like chicken or [place] instead of beef. ? Choose canned seafood, such as tuna, salmon, or sardines. ? Buy eggs as a low-cost source of protein. ? Buy dried beans and peas, such as lentils, split peas, or kidney beans instead of meats. Dried beans and peas are a good alternative source of protein. ? Buy the larger tubs of yogurt instead of individual-sized  containers.  Choose water instead of sodas and other sweetened beverages.  Avoid buying chips, cookies, and other "junk food." These items are usually expensive and not healthy. Cooking  Make extra food and freeze the extras in meal-sized containers or in individual portions for fast meals and snacks.  Pre-cook on days when you have extra time to prepare meals in advance. You can keep these meals in the fridge or freezer and reheat for a quick meal.  When you come home from the grocery store, wash, peel, and cut fruits and vegetables so they are ready to use and eat. This will help reduce food waste. Meal planning  Do not eat out or get fast food. Prepare food at home.  Make a grocery list and make sure to bring it with you to the store. If you have a smart phone, you could use your phone to create your shopping list.  Plan meals and snacks according to a grocery list and budget you create.  Use leftovers in your meal plan for the week.  Look for recipes where you can cook once and make enough food for two meals.  Include budget-friendly meals like stews, casseroles, and stir-fry dishes.  Try some meatless meals or try "no cook" meals like salads.  Make sure that half your plate is filled with fruits or vegetables. Choose from fresh, frozen, or canned fruits and vegetables. If eating canned, remember to rinse them before eating. This will remove any excess salt added for packaging. Summary  Eating healthy on a budget is possible if you plan your meals according to your budget, purchase according to your budget and grocery list, and prepare food yourself.  Tips for buying more food on a limited budget include buying generic brands, using coupons only for foods you normally buy, and buying healthy items from the bulk bins when available.  Tips for buying cheaper food to replace expensive food include choosing cheaper, lean cuts of meat, and buying dried beans and peas. This  information is not intended to replace advice given to you by your health care provider. Make sure you discuss any questions you have with your health care provider. Document Revised: 03/04/2017 Document Reviewed: 03/04/2017 Elsevier Patient Education  2020 [place] protect organs, store calcium, anchor muscles, and support the whole body. Keeping your bones strong is important, especially as you get older. You can take actions to help keep your bones strong and healthy. Why is keeping my bones healthy important?  Keeping your bones healthy is important because your body constantly replaces bone cells. Cells get old, and new cells take their place. As we age, we lose bone cells because the body may not be able to make enough new cells to replace the  old cells. The amount of bone cells and bone tissue you have is referred to as bone mass. The higher your bone mass, the stronger your bones. The aging process leads to an overall loss of bone mass in the body, which can increase the likelihood of:  Joint pain and stiffness.  Broken bones.  A condition in which the bones become weak and brittle (osteoporosis). A large decline in bone mass occurs in older adults. In women, it occurs about the time of menopause. What actions can I take to keep my bones healthy? Good health habits are important for maintaining healthy bones. This includes eating nutritious foods and exercising regularly. To have healthy bones, you need to get enough of the right minerals and vitamins. Most nutrition experts recommend getting these nutrients from the foods that you eat. In some cases, taking supplements may also be recommended. Doing certain types of exercise is also important for bone health. What are the nutritional recommendations for healthy bones?  Eating a well-balanced diet with plenty of calcium and vitamin D will help to protect your bones. Nutritional recommendations vary from person  to person. Ask your health care provider what is healthy for you. Here are some general guidelines. Get enough calcium Calcium is the most important (essential) mineral for bone health. Most people can get enough calcium from their diet, but supplements may be recommended for people who are at risk for osteoporosis. Good sources of calcium include:  Dairy products, such as low-fat or nonfat milk, cheese, and yogurt.  Dark green leafy vegetables, such as bok choy and broccoli.  Calcium-fortified foods, such as orange juice, cereal, bread, soy beverages, and tofu products.  Nuts, such as almonds. Follow these recommended amounts for daily calcium intake:  Children, age 52-3: 700 mg.  Children, age 61-8: 1,000 mg.  Children, age 82-13: 1,300 mg.  Teens, age 68-18: 1,300 mg.  Adults, age 86-50: 1,000 mg.  Adults, age 55-70: ? Men: 1,000 mg. ? Women: 1,200 mg.  Adults, age 25 or older: 1,200 mg.  Pregnant and breastfeeding females: ? Teens: 1,300 mg. ? Adults: 1,000 mg. Get enough vitamin D Vitamin D is the most essential vitamin for bone health. It helps the body absorb calcium. Sunlight stimulates the skin to make vitamin D, so be sure to get enough sunlight. If you live in a cold climate or you do not get outside often, your health care provider may recommend that you take vitamin D supplements. Good sources of vitamin D in your diet include:  Egg yolks.  Saltwater fish.  Milk and cereal fortified with vitamin D. Follow these recommended amounts for daily vitamin D intake:  Children and teens, age 52-18: 600 international units.  Adults, age 65 or younger: 400-800 international units.  Adults, age 28 or older: 800-1,000 international units. Get other important nutrients Other nutrients that are important for bone health include:  Phosphorus. This mineral is found in meat, poultry, dairy foods, nuts, and legumes. The recommended daily intake for adult men and adult women is  700 mg.  Magnesium. This mineral is found in seeds, nuts, dark green vegetables, and legumes. The recommended daily intake for adult men is 400-420 mg. For adult women, it is 310-320 mg.  Vitamin K. This vitamin is found in green leafy vegetables. The recommended daily intake is 120 mg for adult men and 90 mg for adult women. What type of physical activity is best for building and maintaining healthy bones? Weight-bearing and strength-building activities are  important for building and maintaining healthy bones. Weight-bearing activities cause muscles and bones to work against gravity. Strength-building activities increase the strength of the muscles that support bones. Weight-bearing and muscle-building activities include:  Walking and hiking.  Jogging and running.  Dancing.  Gym exercises.  Lifting weights.  Tennis and racquetball.  Climbing stairs.  Aerobics. Adults should get at least 30 minutes of moderate physical activity on most days. Children should get at least 60 minutes of moderate physical activity on most days. Ask your health care provider what type of exercise is best for you. How can I find out if my bone mass is low? Bone mass can be measured with an X-ray test called a bone mineral density (BMD) test. This test is recommended for all women who are age 12 or older. It may also be recommended for:  Men who are age 29 or older.  People who are at risk for osteoporosis because of: ? Having bones that break easily. ? Having a long-term disease that weakens bones, such as kidney disease or rheumatoid arthritis. ? Having menopause earlier than normal. ? Taking medicine that weakens bones, such as steroids, thyroid hormones, or hormone treatment for breast cancer or prostate cancer. ? Smoking. ? Drinking three or more alcoholic drinks a day. If you find that you have a low bone mass, you may be able to prevent osteoporosis or further bone loss by changing your diet and  lifestyle. Where can I find more information? For more information, check out the following websites:  National Osteoporosis Foundation: https://carlson-fletcher.info/  [place]: www.bones.http://www.myers.net/  International Osteoporosis Foundation: Investment banker, operational.iofbonehealth.org Summary  The aging process leads to an overall loss of bone mass in the body, which can increase the likelihood of broken bones and osteoporosis.  Eating a well-balanced diet with plenty of calcium and vitamin D will help to protect your bones.  Weight-bearing and strength-building activities are also important for building and maintaining strong bones.  Bone mass can be measured with an X-ray test called a bone mineral density (BMD) test. This information is not intended to replace advice given to you by your health care provider. Make sure you discuss any questions you have with your health care provider. Document Revised: 03/30/2017 Document Reviewed: 03/30/2017 Elsevier Patient Education  2020 Else[employer] Inc.   Acute Back Pain, Adult Acute back pain is sudden and usually short-lived. It is often caused by an injury to the muscles and tissues in the back. The injury may result from:  A muscle or ligament getting overstretched or torn (strained). Ligaments are tissues that connect bones to each other. Lifting something improperly can cause a back strain.  Wear and tear (degeneration) of the spinal disks. Spinal disks are circular tissue that provides cushioning between the bones of the spine (vertebrae).  Twisting motions, such as while playing sports or doing yard work.  A hit to the back.  Arthritis. You may have a physical exam, lab tests, and imaging tests to find the cause of your pain. Acute back pain usually goes away with rest and home care. Follow these instructions at home: Managing pain, stiffness, and swelling  Take over-the-counter and prescription medicines only as told by your health care  provider.  Your health care provider may recommend applying ice during the first 24-48 hours after your pain starts. To do this: ? Put ice in a plastic bag. ? Place a towel between your skin and the bag. ? Leave the ice on for 20  minutes, 2-3 times a day.  If directed, apply heat to the affected area as often as told by your health care provider. Use the heat source that your health care provider recommends, such as a moist heat pack or a heating pad. ? Place a towel between your skin and the heat source. ? Leave the heat on for 20-30 minutes. ? Remove the heat if your skin turns bright red. This is especially important if you are unable to feel pain, heat, or cold. You have a greater risk of getting burned. Activity   Do not stay in bed. Staying in bed for more than 1-2 days can delay your recovery.  Sit up and stand up straight. Avoid leaning forward when you sit, or hunching over when you stand. ? If you work at a desk, sit close to it so you do not need to lean over. Keep your chin tucked in. Keep your neck drawn back, and keep your elbows bent at a right angle. Your arms should look like the letter "L." ? Sit high and close to the steering wheel when you drive. Add lower back (lumbar) support to your car seat, if needed.  Take short walks on even surfaces as soon as you are able. Try to increase the length of time you walk each day.  Do not sit, drive, or stand in one place for more than 30 minutes at a time. Sitting or standing for long periods of time can put stress on your back.  Do not drive or use heavy machinery while taking prescription pain medicine.  Use proper lifting techniques. When you bend and lift, use positions that put less stress on your back: ? [place] your knees. ? Keep the load close to your body. ? Avoid twisting.  Exercise regularly as told by your health care provider. Exercising helps your back heal faster and helps prevent back injuries by keeping muscles  strong and flexible.  Work with a physical therapist to make a safe exercise program, as recommended by your health care provider. Do any exercises as told by your physical therapist. Lifestyle  Maintain a healthy weight. Extra weight puts stress on your back and makes it difficult to have good posture.  Avoid activities or situations that make you feel anxious or stressed. Stress and anxiety increase muscle tension and can make back pain worse. Learn ways to manage anxiety and stress, such as through exercise. General instructions  Sleep on a firm mattress in a comfortable position. Try lying on your side with your knees slightly bent. If you lie on your back, put a pillow under your knees.  Follow your treatment plan as told by your health care provider. This may include: ? Cognitive or behavioral therapy. ? Acupuncture or massage therapy. ? Meditation or yoga. Contact a health care provider if:  You have pain that is not relieved with rest or medicine.  You have increasing pain going down into your legs or buttocks.  Your pain does not improve after 2 weeks.  You have pain at night.  You lose weight without trying.  You have a fever or chills. Get help right away if:  You develop new bowel or bladder control problems.  You have unusual weakness or numbness in your arms or legs.  You develop nausea or vomiting.  You develop abdominal pain.  You feel faint. Summary  Acute back pain is sudden and usually short-lived.  Use proper lifting techniques. When you bend and lift, use  positions that put less stress on your back.  Take over-the-counter and prescription medicines and apply heat or ice as directed by your health care provider. This information is not intended to replace advice given to you by your health care provider. Make sure you discuss any questions you have with your health care provider. Document Revised: 06/22/2018 Document Reviewed: 10/15/2016 Elsevier  Patient Education  [place].  Pelvic Pain, Female Pelvic pain is pain in your lower abdomen, below your belly button and between your hips. The pain may start suddenly (be acute), keep coming back (be recurring), or last a long time (become chronic). Pelvic pain that lasts longer than 6 months is considered chronic. Pelvic pain may affect your:  Reproductive organs.  Urinary system.  Digestive tract.  Musculoskeletal system. There are many potential causes of pelvic pain. Sometimes, the pain can be a result of digestive or urinary conditions, strained muscles or ligaments, or reproductive conditions. Sometimes the cause of pelvic pain is not known. Follow these instructions at home:   Take over-the-counter and prescription medicines only as told by your health care provider.  Rest as told by your health care provider.  Do not have sex if it hurts.  Keep a journal of your pelvic pain. Write down: ? When the pain started. ? Where the pain is located. ? What seems to make the pain better or worse, such as food or your period (menstrual cycle). ? Any symptoms you have along with the pain.  Keep all follow-up visits as told by your health care provider. This is important. Contact a health care provider if:  Medicine does not help your pain.  Your pain comes back.  You have new symptoms.  You have abnormal vaginal discharge or bleeding, including bleeding after menopause.  You have a fever or chills.  You are constipated.  You have blood in your urine or stool.  You have foul-smelling urine.  You feel weak or light-headed. Get help right away if:  You have sudden severe pain.  Your pain gets steadily worse.  You have severe pain along with fever, nausea, vomiting, or excessive sweating.  You lose consciousness. Summary  Pelvic pain is pain in your lower abdomen, below your belly button and between your hips.  There are many potential causes of pelvic  pain.  Keep a journal of your pelvic pain. This information is not intended to replace advice given to you by your health care provider. Make sure you discuss any questions you have with your health care provider. Document Revised: 08/19/2017 Document Reviewed: 08/19/2017 Elsevier Patient Education  [place].  Secondary Amenorrhea  Secondary amenorrhea occurs when a female who was previously having menstrual periods has not had them for 3-6 months. A menstrual period is the monthly shedding of the lining of the uterus. Menstruation involves the passing of blood, tissue, fluid, and mucus through the vagina. The flow of blood usually occurs during 3-7 consecutive days each month. This condition has many causes. In many cases, treating the underlying cause will return menstrual periods back to a normal cycle. What are the causes? The most common cause of this condition is pregnancy. Other causes include:  Malnutrition.  Cirrhosis of the liver.  Conditions of the blood.  Diabetes.  Epilepsy.  Chronic kidney disease.  Polycystic ovary disease.  Stress or anxiety.  A hormonal imbalance.  Ovarian failure.  Medicines.  Extreme obesity.  Cystic fibrosis.  Low body weight or drastic weight loss.  Early menopause.  Removal of the ovaries or uterus.  Contraceptive pills, patches, or vaginal rings.  Cushing syndrome.  Thyroid problems. What increases the risk? You are more likely to develop this condition if:  You have a family history of this condition.  You have an eating disorder.  You do extreme athletic training.  You have a chronic disease.  You abuse substances such as alcohol or cigarettes. What are the signs or symptoms? The main symptom of this condition is a lack of menstrual periods for 3-6 months. How is this diagnosed? This condition may be diagnosed based on:  Your medical history.  A physical exam.  A pelvic exam to check for  problems with your reproductive organs.  A procedure to examine the uterus.  A measurement of your body mass index (BMI).  Tests, such as: ? Blood tests that measure certain hormones in your body and rule out pregnancy. ? Urine tests. ? Imaging tests, such as an ultrasound, CT scan, or MRI. How is this treated? Treatment for this condition depends on the cause of the amenorrhea. It may involve:  Correcting dietary problems.  Treating underlying conditions.  Medicines.  Lifestyle changes.  Surgery. If the condition cannot be corrected, it is sometimes possible to trigger menstrual periods with medicines. Follow these instructions at home: Lifestyle  Maintain a healthy diet. Ask to meet with a registered dietitian for nutrition counseling and meal planning.  Maintain a healthy weight. Talk to your health care provider before trying any new diet or exercise plan.  Exercise at least 30 minutes 5 or more days each week. Exercising includes brisk walking, yard work, biking, running, swimming, and team sports like basketball and soccer. Ask your health care provider which exercises are safe for you.  Get enough sleep. Plan your sleep time to allow for 7-9 hours of sleep each night.  Learn to manage stress. Explore relaxation techniques such as meditation, journaling, yoga, or tai chi. General instructions  Be aware of changes in your menstrual cycle. Keep a record of when you have your menstrual period. Note the date your period starts, how long it lasts, and any problems you experience.  Take over-the-counter and prescription medicines only as told by your health care provider.  Keep all follow-up visits as told by your health care provider. This is important. Contact a health care provider if:  Your periods do not return to normal after treatment. Summary  Secondary amenorrhea is when a female who was previously having menstrual periods has not gotten her period for 3-6  months.  This condition has many causes. In many cases, treating the underlying cause will return menstrual periods back to a normal cycle.  Talk to your health care provider if your periods do not return to normal after treatment. This information is not intended to replace advice given to you by your health care provider. Make sure you discuss any questions you have with your health care provider. Document Revised: 08/17/2018 Document Reviewed: 05/22/2016 Elsevier Patient Education  [place].
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
## 2020-02-06 ENCOUNTER — Other Ambulatory Visit: Payer: Self-pay

## 2020-02-06 ENCOUNTER — Other Ambulatory Visit: Payer: Medicaid Other

## 2020-02-06 DIAGNOSIS — N912 Amenorrhea, unspecified: Secondary | ICD-10-CM

## 2020-02-07 ENCOUNTER — Telehealth: Payer: Self-pay | Admitting: Obstetrics and Gynecology

## 2020-02-07 LAB — BETA HCG QUANT (REF LAB): hCG Quant: <1 m[IU]/mL

## 2020-02-07 LAB — TESTOSTERONE, FREE, TOTAL, SHBG
Sex Hormone Binding: 37.7 nmol/L (ref 24.6–122.0)
Testosterone, Free: 3.5 pg/mL (ref 0.0–4.2)
Testosterone: 72 ng/dL — ABNORMAL HIGH (ref 13–71)

## 2020-02-07 LAB — FSH/LH
FSH: 10.9 m[IU]/mL
LH: 57.3 m[IU]/mL

## 2020-02-07 LAB — PROLACTIN: Prolactin: 5.4 ng/mL (ref 4.8–23.3)

## 2020-02-07 LAB — DHEA-SULFATE: DHEA-SO4: 264 ug/dL (ref 84.8–378.0)

## 2020-02-07 LAB — TSH+FREE T4
Free T4: 1.28 ng/dL (ref 0.82–1.77)
TSH: 0.926 u[IU]/mL (ref 0.450–4.500)

## 2020-02-07 NOTE — Telephone Encounter (Signed)
Labs have not resulted ye. Looks like they were just done yesterday

## 2020-02-07 NOTE — Telephone Encounter (Signed)
Patient calling to follow up on lab results. Please advise

## 2020-02-07 NOTE — Telephone Encounter (Signed)
Patient aware [facility] will call when results come in

## 2020-02-08 LAB — CYTOLOGY - PAP
Chlamydia: NEGATIVE
Comment: NEGATIVE
Comment: NEGATIVE
Comment: NORMAL
Diagnosis: NEGATIVE
Neisseria Gonorrhea: NEGATIVE
Trichomonas: NEGATIVE

## 2020-02-13 ENCOUNTER — Ambulatory Visit (INDEPENDENT_AMBULATORY_CARE_PROVIDER_SITE_OTHER): Payer: Medicaid Other | Admitting: Family Medicine

## 2020-02-13 ENCOUNTER — Other Ambulatory Visit: Payer: Self-pay

## 2020-02-13 ENCOUNTER — Encounter: Payer: Self-pay | Admitting: Family Medicine

## 2020-02-13 DIAGNOSIS — M545 Low back pain, unspecified: Secondary | ICD-10-CM | POA: Diagnosis not present

## 2020-02-13 MED ORDER — BACLOFEN 10 MG PO TABS
5.0000 mg | ORAL_TABLET | Freq: Three times a day (TID) | ORAL | 3 refills | Status: DC | PRN
Start: 2020-02-13 — End: 2020-02-27

## 2020-02-13 MED ORDER — MELOXICAM 15 MG PO TABS
7.5000 mg | ORAL_TABLET | Freq: Every day | ORAL | 6 refills | Status: DC | PRN
Start: 2020-02-13 — End: 2020-02-27
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
## 2020-02-22 ENCOUNTER — Ambulatory Visit (INDEPENDENT_AMBULATORY_CARE_PROVIDER_SITE_OTHER): Payer: Medicaid Other

## 2020-02-22 ENCOUNTER — Other Ambulatory Visit: Payer: Self-pay

## 2020-02-22 ENCOUNTER — Ambulatory Visit (INDEPENDENT_AMBULATORY_CARE_PROVIDER_SITE_OTHER): Payer: Medicaid Other | Admitting: Obstetrics and Gynecology

## 2020-02-22 ENCOUNTER — Encounter: Payer: Self-pay | Admitting: Obstetrics and Gynecology

## 2020-02-22 ENCOUNTER — Other Ambulatory Visit: Payer: Self-pay | Admitting: Obstetrics and Gynecology

## 2020-02-22 VITALS — BP 122/70 | Ht 71.0 in | Wt 300.0 lb

## 2020-02-22 DIAGNOSIS — Z9189 Other specified personal risk factors, not elsewhere classified: Secondary | ICD-10-CM | POA: Diagnosis not present

## 2020-02-22 DIAGNOSIS — R102 Pelvic and perineal pain: Secondary | ICD-10-CM

## 2020-02-22 NOTE — Progress Notes (Signed)
GYN visit and [place], lab visit asap

## 2020-02-22 NOTE — Patient Instructions (Signed)
Sleep Apnea Sleep apnea is a condition in which breathing pauses or becomes shallow during sleep. Episodes of sleep apnea usually last 10 seconds or longer, and they may occur as many as 20 times an hour. Sleep apnea disrupts your sleep and keeps your body from getting the rest that it needs. This condition can increase your risk of certain health problems, including:  Heart attack.  Stroke.  Obesity.  Diabetes.  Heart failure.  Irregular heartbeat. What are the causes? There are three kinds of sleep apnea:  Obstructive sleep apnea. This kind is caused by a blocked or collapsed airway.  Central sleep apnea. This kind happens when the part of the brain that controls breathing does not send the correct signals to the muscles that control breathing.  Mixed sleep apnea. This is a combination of obstructive and central sleep apnea. The most common cause of this condition is a collapsed or blocked airway. An airway can collapse or become blocked if:  Your throat muscles are abnormally relaxed.  Your tongue and tonsils are larger than normal.  You are overweight.  Your airway is smaller than normal. What increases the risk? You are more likely to develop this condition if you:  Are overweight.  Smoke.  Have a smaller than normal airway.  Are elderly.  Are female.  Drink alcohol.  Take sedatives or tranquilizers.  Have a family history of sleep apnea. What are the signs or symptoms? Symptoms of this condition include:  Trouble staying asleep.  Daytime sleepiness and tiredness.  Irritability.  Loud snoring.  Morning headaches.  Trouble concentrating.  Forgetfulness.  Decreased interest in sex.  Unexplained sleepiness.  Mood swings.  Personality changes.  Feelings of depression.  Waking up often during the night to urinate.  Dry mouth.  Sore throat. How is this diagnosed? This condition may be diagnosed with:  A medical history.  A physical  exam.  A series of tests that are done while you are sleeping (sleep study). These tests are usually done in a sleep lab, but they may also be done at home. How is this treated? Treatment for this condition aims to restore normal breathing and to ease symptoms during sleep. It may involve managing health issues that can affect breathing, such as high blood pressure or obesity. Treatment may include:  Sleeping on your side.  Using a decongestant if you have nasal congestion.  Avoiding the use of depressants, including alcohol, sedatives, and narcotics.  Losing weight if you are overweight.  Making changes to your diet.  Quitting smoking.  Using a device to open your airway while you sleep, such as: ? An oral appliance. This is a custom-made mouthpiece that shifts your lower jaw forward. ? A continuous positive airway pressure (CPAP) device. This device blows air through a mask when you breathe out (exhale). ? A nasal expiratory positive airway pressure (EPAP) device. This device has valves that you put into each nostril. ? A bi-level positive airway pressure (BPAP) device. This device blows air through a mask when you breathe in (inhale) and breathe out (exhale).  Having surgery if other treatments do not work. During surgery, excess tissue is removed to create a wider airway. It is important to get treatment for sleep apnea. Without treatment, this condition can lead to:  High blood pressure.  Coronary artery disease.  In men, an inability to achieve or maintain an erection (impotence).  Reduced thinking abilities. Follow these instructions at home: Lifestyle  Make any lifestyle changes   that your health care provider recommends.  Eat a healthy, well-balanced diet.  Take steps to lose weight if you are overweight.  Avoid using depressants, including alcohol, sedatives, and narcotics.  Do not use any products that contain nicotine or tobacco, such as cigarettes,  e-cigarettes, and chewing tobacco. If you need help quitting, ask your health care provider. General instructions  Take over-the-counter and prescription medicines only as told by your health care provider.  If you were given a device to open your airway while you sleep, use it only as told by your health care provider.  If you are having surgery, make sure to tell your health care provider you have sleep apnea. You may need to bring your device with you.  Keep all follow-up visits as told by your health care provider. This is important. Contact a health care provider if:  The device that you received to open your airway during sleep is uncomfortable or does not seem to be working.  Your symptoms do not improve.  Your symptoms get worse. Get help right away if:  You develop: ? Chest pain. ? Shortness of breath. ? Discomfort in your back, arms, or stomach.  You have: ? Trouble speaking. ? Weakness on one side of your body. ? Drooping in your face. These symptoms may represent a serious problem that is an emergency. Do not wait to see if the symptoms will go away. Get medical help right away. Call your local emergency services (911 in the [place] Do not drive yourself to the hospital. Summary  Sleep apnea is a condition in which breathing pauses or becomes shallow during sleep.  The most common cause is a collapsed or blocked airway.  The goal of treatment is to restore normal breathing and to ease symptoms during sleep. This information is not intended to replace advice given to you by your health care provider. Make sure you discuss any questions you have with your health care provider. Document Revised: 08/18/2018 Document Reviewed: 10/27/2017 Elsevier Patient Education  2020 [employer] Diagnostic Laparoscopy Diagnostic laparoscopy is a procedure to diagnose diseases in the abdomen. It might be done for a variety of reasons, such as to look for scar tissue, cancer, or a  reason for abdomen (abdominal) pain. During the procedure, a thin, flexible tube that has a light and a camera on the end (laparoscope) is inserted through an incision in the abdomen. The image from the camera is shown on a monitor to help your surgeon see inside your body. Tell a health care provider about:  Any allergies you have.  All medicines you are taking, including vitamins, herbs, eye drops, creams, and over-the-counter medicines.  Any problems you or family members have had with anesthetic medicines.  Any blood disorders you have.  Any surgeries you have had.  Any medical conditions you have. What are the risks? Generally, this is a safe procedure. However, problems may occur, including:  Infection.  Bleeding.  Allergic reactions to medicines or dyes.  Damage to abdominal structures or organs, such as the intestines, liver, stomach, or spleen. What happens before the procedure? Medicines  Ask your health care provider about: ? Changing or stopping your regular medicines. This is especially important if you are taking diabetes medicines or blood thinners. ? Taking medicines such as aspirin and ibuprofen. These medicines can thin your blood. Do not take these medicines unless your health care provider tells you to take them. ? Taking over-the-counter medicines, vitamins, herbs, and supplements.  You may be given antibiotic medicine to help prevent infection. Staying hydrated Follow instructions from your health care provider about hydration, which may include:  Up to 2 hours before the procedure - you may continue to drink clear liquids, such as water, clear fruit juice, black coffee, and plain tea. Eating and drinking restrictions Follow instructions from your health care provider about eating and drinking, which may include:  8 hours before the procedure - stop eating heavy meals or foods such as meat, fried foods, or fatty foods.  6 hours before the procedure -  stop eating light meals or foods, such as toast or cereal.  6 hours before the procedure - stop drinking milk or drinks that contain milk.  2 hours before the procedure - stop drinking clear liquids. General instructions  Ask your health care provider how your surgical site will be marked or identified.  You may be asked to shower with a germ-killing soap.  Plan to have someone take you home from the hospital or clinic.  Plan to have a responsible adult care for you for at least 24 hours after you leave the hospital or clinic. This is important. What happens during the procedure?   To lower your risk of infection: ? Your health care team will wash or sanitize their hands. ? Hair may be removed from the surgical area. ? Your skin will be washed with soap.  An IV will be inserted into one of your veins.  You will be given a medicine to make you fall asleep (general anesthetic). You may also be given a medicine to help you relax (sedative).  A breathing tube will be placed down your throat to help you breathe during the procedure.  Your abdomen will be filled with an air-like gas so it expands. This will give the surgeon more room to operate and will make your organs easier to see.  Many small incisions will be made in your abdomen.  A laparoscope and other surgical instruments will be inserted into your abdomen through the incisions.  A tissue sample may be removed from an organ for examination (biopsy). This will depend on the reason why you are having this procedure.  The laparoscope and other instruments will be removed from your abdomen.  The gas will be released.  Your incisions will be closed with stitches (sutures) and covered with a bandage (dressing).  Your breathing tube will be removed. The procedure may vary among health care providers and hospitals. What happens after the procedure?   Your blood pressure, heart rate, breathing rate, and blood oxygen level  will be monitored until the medicines you were given have worn off.  Do not drive for 24 hours if you were given a sedative during your procedure.  It is up to you to get the results of your procedure. Ask your health care provider, or the department that is doing the procedure, when your results will be ready. Summary  Diagnostic laparoscopy is a way to look for problems in the abdomen using small incisions.  Follow instructions from your health care provider about how to prepare for the procedure.  Plan to have a responsible adult care for you for at least 24 hours after you leave the hospital or clinic. This is important. This information is not intended to replace advice given to you by your health care provider. Make sure you discuss any questions you have with your health care provider. Document Revised: 02/13/2017 Document Reviewed: 08/27/2016 Elsevier Patient  Education  [employer].

## 2020-02-22 NOTE — Progress Notes (Signed)
Patient ID: Ashley Cooper, female   DOB: 28-Dec-1990, 28 y.o.   MRN: [id number]  Reason for Consult: Gynecologic Exam   Referred by [facility]  Subjective:     HPI:  Ashley Cooper is a 29 y.o. female/she is following up today regarding her previous visit.  At that time she had concerns about pregnancy.  She had a beta-hCG which showed that she is not pregnant.  Her other hormone testing labs were within normal limits.  Today's transvaginal ultrasound is also normal.  Patient reports that her menstrual cycle is about to start.  She has no new complaints.  History reviewed. No pertinent past medical history. History reviewed. No pertinent family history. Past Surgical History:  Procedure Laterality Date  . CESAREAN SECTION    . CESAREAN SECTION WITH BILATERAL TUBAL LIGATION Bilateral 02/13/2016   Procedure: CESAREAN SECTION WITH BILATERAL TUBAL LIGATION;  Surgeon: Suzy Bouchard, MD;  Location: [facility] ORS;  Service: Obstetrics;  Laterality: Bilateral;  Placenta Partial Adhesion to Uterine Wall   . Csection x3      Short Social History:  Social History   Tobacco Use  . Smoking status: Former Smoker    Packs/day: 2.00    Types: Cigarettes  . Smokeless tobacco: Never Used  Substance Use Topics  . Alcohol use: No    No Known Allergies  Current Outpatient Medications  Medication Sig Dispense Refill  . baclofen (LIORESAL) 10 MG tablet Take 0.5-1 tablets (5-10 mg total) by mouth 3 (three) times daily as needed for muscle spasms. (Patient not taking: Reported on 02/22/2020) 30 each 3  . cyclobenzaprine (FLEXERIL) 5 MG tablet Take 1 tablet (5 mg total) by mouth 3 (three) times daily as needed for muscle spasms. (Patient not taking: Reported on 02/22/2020) 15 tablet 0  . meloxicam (MOBIC) 15 MG tablet Take 0.5-1 tablets (7.5-15 mg total) by mouth daily as needed for pain. (Patient not taking: Reported on 02/22/2020) 30 tablet 6   No current  facility-administered medications for this visit.    Review of Systems  Constitutional: Negative for chills, fatigue, fever and unexpected weight change.  HENT: Negative for trouble swallowing.  Eyes: Negative for loss of vision.  Respiratory: Negative for cough, shortness of breath and wheezing.  Cardiovascular: Negative for chest pain, leg swelling, palpitations and syncope.  GI: Negative for abdominal pain, blood in stool, diarrhea, nausea and vomiting.  GU: Negative for difficulty urinating, dysuria, frequency and hematuria.  Musculoskeletal: Negative for back pain, leg pain and joint pain.  Skin: Negative for rash.  Neurological: Negative for dizziness, headaches, light-headedness, numbness and seizures.  Psychiatric: Negative for behavioral problem, confusion, depressed mood and sleep disturbance.        Objective:  Objective   Vitals:   02/22/20 1138  BP: 122/70  Weight: 300 lb (136.1 kg)  Height: 5\' 11"  (1.803 m)   Body mass index is 41.84 kg/m.  Physical Exam Vitals and nursing note reviewed.  Constitutional:      Appearance: She is well-developed.  HENT:     Head: Normocephalic and atraumatic.  Eyes:     Pupils: Pupils are equal, round, and reactive to light.  Cardiovascular:     Rate and Rhythm: Normal rate and regular rhythm.  Pulmonary:     Effort: Pulmonary effort is normal. No respiratory distress.  Skin:    General: Skin is warm and dry.  Neurological:     Mental Status: She is alert and oriented to person,  place, and time.  Psychiatric:        Behavior: Behavior normal.        Thought Content: Thought content normal.        Judgment: Judgment normal.     Do you snore loudly? ( louder than talking or loud enough to be heard through closed doors?) YES Do you often feel tired, fatigued, or sleepy during daytime? YES Has anyone observed you stop breathing during sleep? NO Do you have or are you being treated for high blood pressure? NO BMI>  35 YES Age> 50 NO Neck circumference > 40 cm YES, 41 cm Female gender? NO   ** if yes to > 3 questions high risk of obstructive sleep apnea ** if yes to <3 questions+ low risk for obstructive sleep apnea   Assessment/Plan:     29 year old following up for pelvic ultrasound and discussion of pelvic pain. Patient does desire to have a lot laparoscopy to help definitively diagnose if she has any issues with endometriosis.  She is like to plan this for 3 to 6 months from now.  Note was sent to surgical scheduler.  We will follow up with the patient before surgery.  Patient provided with handout regarding diagnostic laparoscopy.  Patient has screened positive for obstructive sleep apnea.  Will refer her for sleep screening studies.  More than 20 minutes were spent face to face with the patient in the room, reviewing the medical record, labs and images, and coordinating care for the patient. The plan of management was discussed in detail and counseling was provided.       Adelene Idler MD [facility], [facility] 02/22/2020 12:01 PM

## 2020-02-27 ENCOUNTER — Ambulatory Visit (INDEPENDENT_AMBULATORY_CARE_PROVIDER_SITE_OTHER): Payer: Medicaid Other | Admitting: Internal Medicine

## 2020-02-27 VITALS — BP 116/77 | HR 69 | Temp 97.7°F | Resp 16 | Ht 71.0 in | Wt 292.0 lb

## 2020-02-27 DIAGNOSIS — Z6841 Body Mass Index (BMI) 40.0 and over, adult: Secondary | ICD-10-CM

## 2020-02-27 DIAGNOSIS — G4719 Other hypersomnia: Secondary | ICD-10-CM | POA: Diagnosis not present
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
## 2020-02-27 NOTE — Patient Instructions (Signed)
Sleep Studies A sleep study (polysomnogram) is a series of tests done while you are sleeping. A sleep study records your brain waves, heart rate, breathing rate, oxygen level, and eye and leg movements. A sleep study helps your health care provider:  See how well you sleep.  Diagnose a sleep disorder.  Determine how severe your sleep disorder is.  Create a plan to treat your sleep disorder. Your health care provider may recommend a sleep study if you:  Feel sleepy on most days.  Snore loudly while sleeping.  Have unusual behaviors while you sleep, such as walking.  Have brief periods in which you stop breathing during sleep (sleepapnea).  Fall asleep suddenly during the day (narcolepsy).  Have trouble falling asleep or staying asleep (insomnia).  Feel like you need to move your legs when trying to fall asleep (restless legs syndrome).  Move your legs by flexing and extending them regularly while asleep (periodic limb movement disorder).  Act out your dreams while you sleep (sleep behavior disorder).  Feel like you cannot move when you first wake up (sleep paralysis). What tests are part of a sleep study? Most sleep studies record the following during sleep:  Brain activity.  Eye movements.  Heart rate and rhythm.  Breathing rate and rhythm.  Blood-oxygen level.  Blood pressure.  Chest and belly movement as you breathe.  Arm and leg movements.  Snoring or other noises.  Body position. Where are sleep studies done? Sleep studies are done at sleep centers. A sleep center may be inside a hospital, office, or clinic. The room where you have the study may look like a hospital room or a hotel room. The health care providers doing the study may come in and out of the room during the study. Most of the time, they will be in another room monitoring your test as you sleep. How are sleep studies done? Most sleep studies are done during a normal period of time for a full  night of sleep. You will arrive at the study center in the evening and go home in the morning. Before the test  Bring your pajamas and toothbrush with you to the sleep study.  Do not have caffeine on the day of your sleep study.  Do not drink alcohol on the day of your sleep study.  Your health care provider will let you know if you should stop taking any of your regular medicines before the test. During the test      Round, sticky patches with sensors attached to recording wires (electrodes) are placed on your scalp, face, chest, and limbs.  Wires from all the electrodes and sensors run from your bed to a computer. The wires can be taken off and put back on if you need to get out of bed to go to the bathroom.  A sensor is placed over your nose to measure airflow.  A finger clip is put on your finger or ear to measure your blood oxygen level (pulse oximetry).  A belt is placed around your belly and a belt is placed around your chest to measure breathing movements.  If you have signs of the sleep disorder called sleep apnea during your test, you may get a treatment mask to wear for the second half of the night. ? The mask provides positive airway pressure (PAP) to help you breathe better during sleep. This may greatly improve your sleep apnea. ? You will then have all tests done again with the mask   in place to see if your measurements and recordings change. After the test  A medical doctor who specializes in sleep will evaluate the results of your sleep study and share them with you and your primary health care provider.  Based on your results, your medical history, and a physical exam, you may be diagnosed with a sleep disorder, such as: ? Sleep apnea. ? Restless legs syndrome. ? Sleep-related behavior disorder. ? Sleep-related movement disorders. ? Sleep-related seizure disorders.  Your health care team will help determine your treatment options based on your diagnosis. This  may include: ? Improving your sleep habits (sleep hygiene). ? Wearing a continuous positive airway pressure (CPAP) or bi-level positive airway pressure (BPAP) mask. ? Wearing an oral device at night to improve breathing and reduce snoring. ? Taking medicines. Follow these instructions at home:  Take over-the-counter and prescription medicines only as told by your health care provider.  If you are instructed to use a CPAP or BPAP mask, make sure you use it nightly as directed.  Make any lifestyle changes that your health care provider recommends.  If you were given a device to open your airway while you sleep, use it only as told by your health care provider.  Do not use any tobacco products, such as cigarettes, chewing tobacco, and e-cigarettes. If you need help quitting, ask your health care provider.  Keep all follow-up visits as told by your health care provider. This is important. Summary  A sleep study (polysomnogram) is a series of tests done while you are sleeping. It shows how well you sleep.  Most sleep studies are done over one full night of sleep. You will arrive at the study center in the evening and go home in the morning.  If you have signs of the sleep disorder called sleep apnea during your test, you may get a treatment mask to wear for the second half of the night.  A medical doctor who specializes in sleep will evaluate the results of your sleep study and share them with your primary health care provider. This information is not intended to replace advice given to you by your health care provider. Make sure you discuss any questions you have with your health care provider. Document Revised: 08/18/2018 Document Reviewed: 03/31/2017 Elsevier Patient Education  2020 [employer]  

## 2020-03-05 ENCOUNTER — Ambulatory Visit: Payer: Medicaid Other | Attending: Family Medicine | Admitting: Physical Therapy

## 2020-03-05 ENCOUNTER — Encounter: Payer: Self-pay | Admitting: Physical Therapy

## 2020-03-05 ENCOUNTER — Other Ambulatory Visit: Payer: Self-pay

## 2020-03-05 DIAGNOSIS — M545 Low back pain, unspecified: Secondary | ICD-10-CM | POA: Insufficient documentation

## 2020-03-05 DIAGNOSIS — G8929 Other chronic pain: Secondary | ICD-10-CM | POA: Insufficient documentation
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
## 2020-03-05 NOTE — Patient Instructions (Signed)
Access Code: [email or web address] URL: [email or web address] Date: 03/05/2020 Prepared by: Lulu Riding  Exercises Seated Flexion Stretch with Swiss Ball - 1 x daily - 7 x weekly - 2 sets - 2 reps - 30 seconds hold Supine Hamstring Stretch with Strap - 1 x daily - 7 x weekly - 2 reps - 2 sets - 30 hold Seated Hamstring Stretch - 1 x daily - 7 x weekly - 2 reps - 2 sets - 30 hold SLR - 1 x daily - 7 x weekly - 2 sets - 15 reps - 1 hold Sidelying Hip Abduction - 1 x daily - 7 x weekly - 2 sets - 15 reps Isometric Dead Bug - 1 x daily - 7 x weekly - 2 sets - 10 reps - 10 seconds hold Sitting to Supine Roll - 7 x weekly

## 2020-03-27 ENCOUNTER — Ambulatory Visit: Payer: Medicaid Other

## 2020-04-03 ENCOUNTER — Ambulatory Visit: Payer: Medicaid Other | Attending: Family Medicine

## 2020-04-16 ENCOUNTER — Telehealth: Payer: Self-pay

## 2020-04-16 NOTE — Telephone Encounter (Signed)
Called 03/28/20 and left msg for pt to rtn call - I forgot to document  04/16/20 - Called pt to schedule diag lap w Schuman. Pt adv that she will look at her work schedule and get back in touch with me to schedule.
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
## 2021-11-04 ENCOUNTER — Other Ambulatory Visit: Payer: Self-pay

## 2021-11-04 ENCOUNTER — Emergency Department
Admission: EM | Admit: 2021-11-04 | Discharge: 2021-11-04 | Disposition: A | Discharge status: Home / Self Care | Payer: Medicaid Other | Attending: Emergency Medicine | Admitting: Emergency Medicine

## 2021-11-04 ENCOUNTER — Encounter: Payer: Self-pay | Admitting: Intensive Care

## 2021-11-04 DIAGNOSIS — E1165 Type 2 diabetes mellitus with hyperglycemia: Secondary | ICD-10-CM | POA: Diagnosis not present

## 2021-11-04 DIAGNOSIS — E876 Hypokalemia: Secondary | ICD-10-CM | POA: Insufficient documentation

## 2021-11-04 DIAGNOSIS — Z7984 Long term (current) use of oral hypoglycemic drugs: Secondary | ICD-10-CM | POA: Insufficient documentation

## 2021-11-04 DIAGNOSIS — M79631 Pain in right forearm: Secondary | ICD-10-CM | POA: Diagnosis not present

## 2021-11-04 DIAGNOSIS — M79632 Pain in left forearm: Secondary | ICD-10-CM | POA: Insufficient documentation

## 2021-11-04 HISTORY — DX: Type 2 diabetes mellitus without complications: E11.9

## 2021-11-04 HISTORY — DX: Pure hypercholesterolemia, unspecified: E78.00

## 2021-11-04 LAB — COMPREHENSIVE METABOLIC PANEL
ALT: 45 U/L — ABNORMAL HIGH (ref 0–44)
AST: 32 U/L (ref 15–41)
Albumin: 3.7 g/dL (ref 3.5–5.0)
Alkaline Phosphatase: 87 U/L (ref 38–126)
Anion gap: 8 (ref 5–15)
BUN: 7 mg/dL (ref 6–20)
CO2: 19 mmol/L — ABNORMAL LOW (ref 22–32)
Calcium: 8.7 mg/dL — ABNORMAL LOW (ref 8.9–10.3)
Chloride: 107 mmol/L (ref 98–111)
Creatinine, Ser: 0.53 mg/dL (ref 0.44–1.00)
GFR, Estimated: >60 mL/min (ref >60)
Glucose, Bld: 174 mg/dL — ABNORMAL HIGH (ref 70–99)
Potassium: 3.3 mmol/L — ABNORMAL LOW (ref 3.5–5.1)
Sodium: 134 mmol/L — ABNORMAL LOW (ref 135–145)
Total Bilirubin: 1.4 mg/dL — ABNORMAL HIGH (ref 0.3–1.2)
Total Protein: 7.5 g/dL (ref 6.5–8.1)

## 2021-11-04 LAB — CBC WITH DIFFERENTIAL/PLATELET
Abs Immature Granulocytes: 0.05 10*3/uL (ref 0.00–0.07)
Basophils Absolute: 0.1 10*3/uL (ref 0.0–0.1)
Basophils Relative: 1 %
Eosinophils Absolute: 0.1 10*3/uL (ref 0.0–0.5)
Eosinophils Relative: 1 %
HCT: 39.9 % (ref 36.0–46.0)
Hemoglobin: 13.1 g/dL (ref 12.0–15.0)
Immature Granulocytes: 1 %
Lymphocytes Relative: 24 %
Lymphs Abs: 2.6 10*3/uL (ref 0.7–4.0)
MCH: 27 pg (ref 26.0–34.0)
MCHC: 32.8 g/dL (ref 30.0–36.0)
MCV: 82.3 fL (ref 80.0–100.0)
Monocytes Absolute: 0.7 10*3/uL (ref 0.1–1.0)
Monocytes Relative: 6 %
Neutro Abs: 7.2 10*3/uL (ref 1.7–7.7)
Neutrophils Relative %: 67 %
Platelets: 330 10*3/uL (ref 150–400)
RBC: 4.85 MIL/uL (ref 3.87–5.11)
RDW: 14.6 % (ref 11.5–15.5)
WBC: 10.8 10*3/uL — ABNORMAL HIGH (ref 4.0–10.5)
nRBC: 0 % (ref 0.0–0.2)

## 2021-11-04 LAB — SEDIMENTATION RATE: Sed Rate: 17 mm/hr (ref 0–20)

## 2021-11-04 LAB — CBG MONITORING, ED: Glucose-Capillary: 163 mg/dL — ABNORMAL HIGH (ref 70–99)

## 2021-11-04 LAB — POC URINE PREG, ED: Preg Test, Ur: NEGATIVE

## 2021-11-04 MED ORDER — POTASSIUM CHLORIDE CRYS ER 20 MEQ PO TBCR
10.0000 meq | EXTENDED_RELEASE_TABLET | Freq: Once | ORAL | Status: AC
  Administered 2021-11-04: 10 meq via ORAL
  Filled 2021-11-04: qty 1

## 2021-11-04 MED ORDER — KETOROLAC TROMETHAMINE 30 MG/ML IJ SOLN
30.0000 mg | Freq: Once | INTRAMUSCULAR | Status: AC
  Administered 2021-11-04: 30 mg via INTRAMUSCULAR
  Filled 2021-11-04: qty 1

## 2021-11-04 MED ORDER — POTASSIUM CHLORIDE CRYS ER 10 MEQ PO TBCR: 10.0000 meq | EXTENDED_RELEASE_TABLET | Freq: Every day | ORAL | 0 refills | Status: AC

## 2021-11-04 MED ORDER — NAPROXEN 500 MG PO TABS: 500.0000 mg | ORAL_TABLET | Freq: Two times a day (BID) | ORAL | 0 refills | Status: AC

## 2021-11-04 NOTE — ED Triage Notes (Signed)
Patient c/o pain from bilateral elbows down to hands. Reports pain started through the night

## 2021-11-04 NOTE — Discharge Instructions (Addendum)
Call make an appoint with your primary care provider for further evaluation and if any continued forearm pain.  2 prescriptions were sent to the pharmacy 1 is for inflammation which will help with the pain in your arms to be taken twice a day with food and the potassium tablet is 1 daily for the next 5 days.  Today you had 1 tablet in the emergency departments therefore start your medication tomorrow.  Increase fluids to stay hydrated.

## 2021-11-04 NOTE — ED Notes (Signed)
See triage note  Presents with pain to both arms  States pain starts at elbow and moves into lower arms and hands  denies any injury  No deformity  Good pulses

## 2021-11-04 NOTE — ED Provider Notes (Signed)
[facility] Provider Note    Event Date/Time   First MD Initiated Contact with Patient 11/04/21 [phone]     (approximate)   History   Arm Pain   HPI  Ashley Cooper is a 31 y.o. female   presents to the ED with complaint of bilateral forearm pain that began during the night.  Patient states that there has been no injury or previous similar problems.  She denies any repetitive work.  She describes her pain from her elbows down to her fingers.  No over-the-counter medication has been taken.  Patient has a history of diabetes and is currently on metformin and Ozempic and has a history of hypercholesterolemia.        Physical Exam   Triage Vital Signs: ED Triage Vitals  Enc Vitals Group     BP 11/04/21 0803 129/75     Pulse Rate 11/04/21 0803 (!) 105     Resp 11/04/21 0803 16     Temp 11/04/21 0803 97.9 F (36.6 C)     Temp Source 11/04/21 0803 Oral     SpO2 11/04/21 0803 99 %     Weight 11/04/21 0801 285 lb (129.3 kg)     Height 11/04/21 0801 5\' 11"  (1.803 m)     Head Circumference --      Peak Flow --      Pain Score 11/04/21 0800 10     Pain Loc --      Pain Edu? --      Excl. in GC? --     Most recent vital signs: Vitals:   11/04/21 0803 11/04/21 1043  BP: 129/75 128/70  Pulse: (!) 105 89  Resp: 16 16  Temp: 97.9 F (36.6 C)   SpO2: 99% 99%     General: Awake, no distress.  CV:  Good peripheral perfusion.  Heart regular rate and rhythm. Resp:  Normal effort.  Lungs clear bilaterally. Abd:  No distention.  Other:  No deformity or soft tissue edema noted to the upper extremities bilaterally.  Generalized tenderness on palpation of the forearms bilaterally.  Radial pulse present.  Skin is intact.  No skin discoloration present.  Patient is able to move digits distally without any difficulty motor or sensory function intact.  Patient's grip strength is equal bilaterally at 4/5.  No other joint or soft tissue tenderness on palpation and  patient is ambulatory without any assistance.   ED Results / Procedures / Treatments   Labs (all labs ordered are listed, but only abnormal results are displayed) Labs Reviewed  CBC WITH DIFFERENTIAL/PLATELET - Abnormal; Notable for the following components:      Result Value   WBC 10.8 (*)    All other components within normal limits  COMPREHENSIVE METABOLIC PANEL - Abnormal; Notable for the following components:   Sodium 134 (*)    Potassium 3.3 (*)    CO2 19 (*)    Glucose, Bld 174 (*)    Calcium 8.7 (*)    ALT 45 (*)    Total Bilirubin 1.4 (*)    All other components within normal limits  CBG MONITORING, ED - Abnormal; Notable for the following components:   Glucose-Capillary 163 (*)    All other components within normal limits  SEDIMENTATION RATE  POC URINE PREG, ED    PROCEDURES:  Critical Care performed:   Procedures   MEDICATIONS ORDERED IN ED: Medications  potassium chloride SA (KLOR-CON M) CR tablet 10 mEq (10  mEq Oral Given 11/04/21 0939)  ketorolac (TORADOL) 30 MG/ML injection 30 mg (30 mg Intramuscular Given 11/04/21 0940)     IMPRESSION / MDM / ASSESSMENT AND PLAN / ED COURSE  I reviewed the triage vital signs and the nursing notes.   Differential diagnosis includes, but is not limited to, muscle skeletal pain, muscle strain, epicondylitis bilaterally but unlikely.  31 year old female presents to the ED with complaint of bilateral forearm pain that started during the night.  No history of injury and patient has not taken any over-the-counter medications for her pain prior to arrival.  Physical exam was benign.  Lab work is unremarkable with the exception of her glucose being elevated at 174 however patient states that she continues to take her diabetes medicine as prescribed and in the past has been as high as 300.  Potassium was slightly low at 3.3, sed rate 17 and pregnancy test was negative.  Patient was given K. Dur 10 mEq while in the ED and an  injection of Toradol 30 mg IM.  Approximately 45 minutes after the injection was given I went to reassess patient and she was asleep however she was sleeping with her head on her right forearm.  When I woke her up she stated that the pain was less and I explained to her that sleeping with her head on top of her forearm is compressing nerves in the muscles and this may be part of the reason why her forearms hurt today.  A prescription for naproxen 500 mg twice daily with food was given to the patient along with a 5-day prescription for K-Dur 10 milliequivalents 1 daily.  She was given information about potassium rich foods, musculoskeletal pain and encouraged strongly to follow-up with her PCP if any continued problems for further evaluation.      Patient's presentation is most consistent with acute complicated illness / injury requiring diagnostic workup.  FINAL CLINICAL IMPRESSION(S) / ED DIAGNOSES   Final diagnoses:  Pain in both forearms  Hypokalemia     Rx / DC Orders   ED Discharge Orders          Ordered    naproxen (NAPROSYN) 500 MG tablet  2 times daily with meals        11/04/21 1036    potassium chloride (KLOR-CON M) 10 MEQ tablet  Daily        11/04/21 1036             Note:  This document was prepared using Dragon voice recognition software and may include unintentional dictation errors.   Tommi Rumps, PA-C 11/04/21 1056    Chesley Noon, MD 11/04/21 1520

## 2023-02-02 ENCOUNTER — Emergency Department: Payer: Medicaid Other

## 2023-02-02 ENCOUNTER — Other Ambulatory Visit: Payer: Self-pay

## 2023-02-02 ENCOUNTER — Encounter: Payer: Self-pay | Admitting: Emergency Medicine

## 2023-02-02 ENCOUNTER — Emergency Department
Admission: EM | Admit: 2023-02-02 | Discharge: 2023-02-02 | Disposition: A | Discharge status: Home / Self Care | Payer: Medicaid Other | Attending: Emergency Medicine | Admitting: Emergency Medicine

## 2023-02-02 DIAGNOSIS — E119 Type 2 diabetes mellitus without complications: Secondary | ICD-10-CM | POA: Diagnosis not present

## 2023-02-02 DIAGNOSIS — Z1152 Encounter for screening for COVID-19: Secondary | ICD-10-CM | POA: Diagnosis not present

## 2023-02-02 DIAGNOSIS — J209 Acute bronchitis, unspecified: Secondary | ICD-10-CM | POA: Diagnosis not present

## 2023-02-02 DIAGNOSIS — J40 Bronchitis, not specified as acute or chronic: Secondary | ICD-10-CM

## 2023-02-02 DIAGNOSIS — R0602 Shortness of breath: Secondary | ICD-10-CM | POA: Diagnosis present

## 2023-02-02 LAB — RESP PANEL BY RT-PCR (RSV, FLU A&B, COVID)  RVPGX2
Influenza A by PCR: NEGATIVE
Influenza B by PCR: NEGATIVE
Resp Syncytial Virus by PCR: NEGATIVE
SARS Coronavirus 2 by RT PCR: NEGATIVE

## 2023-02-02 MED ORDER — GUAIFENESIN-CODEINE 100-10 MG/5ML PO SOLN: 5.0000 mL | Freq: Four times a day (QID) | ORAL | 0 refills | Status: AC | PRN

## 2023-02-02 MED ORDER — ALBUTEROL SULFATE HFA 108 (90 BASE) MCG/ACT IN AERS: 2.0000 | INHALATION_SPRAY | RESPIRATORY_TRACT | Status: DC | PRN

## 2023-02-02 MED ORDER — IPRATROPIUM-ALBUTEROL 0.5-2.5 (3) MG/3ML IN SOLN
3.0000 mL | Freq: Once | RESPIRATORY_TRACT | Status: AC
  Administered 2023-02-02: 3 mL via RESPIRATORY_TRACT
  Filled 2023-02-02: qty 3

## 2023-02-02 MED ORDER — AZITHROMYCIN 250 MG PO TABS: ORAL_TABLET | ORAL | 0 refills | Status: AC

## 2023-02-02 NOTE — ED Triage Notes (Signed)
Pt here for SOB. Pt wheezy sounding. Started last night and has progressively gotten worse. Lightheadedness, and dry cough

## 2023-02-02 NOTE — ED Provider Notes (Signed)
[facility] Provider Note    Event Date/Time   First MD Initiated Contact with Patient 02/02/23 1527     (approximate)  History   Chief Complaint: Shortness of Breath  HPI  Ashley Cooper is a 32 y.o. female with a past medical history of diabetes, hyperlipidemia, presents to the emergency department for cough and congestion.  According to the patient for the last 2 days she has been coughing with mild congestion.  States anytime she inhales it makes her cough.  Did not get much sleep last night due to coughing.  Patient denies any fever.  Denies any chest pain.  No pleuritic pain.  Patient has a frequent dry cough during my evaluation.  Physical Exam   Triage Vital Signs: ED Triage Vitals  Encounter Vitals Group     BP 02/02/23 1412 (!) 146/111     Systolic BP Percentile --      Diastolic BP Percentile --      Pulse Rate 02/02/23 1412 (!) 110     Resp 02/02/23 1412 (!) 22     Temp 02/02/23 1412 98.7 F (37.1 C)     Temp Source 02/02/23 1412 Oral     SpO2 02/02/23 1412 100 %     Weight 02/02/23 1539 285 lb 0.9 oz (129.3 kg)     Height 02/02/23 1539 5\' 11"  (1.803 m)     Head Circumference --      Peak Flow --      Pain Score 02/02/23 1410 9     Pain Loc --      Pain Education --      Exclude from Growth Chart --     Most recent vital signs: Vitals:   02/02/23 1412  BP: (!) 146/111  Pulse: (!) 110  Resp: (!) 22  Temp: 98.7 F (37.1 C)  SpO2: 100%    General: Awake, no distress.  CV:  Good peripheral perfusion.  Regular rate and rhythm  Resp:  Normal effort.  Equal breath sounds bilaterally.  No wheeze rales or rhonchi Abd:  No distention.  Soft, nontender.  No rebound or guarding.  ED Results / Procedures / Treatments   EKG  EKG viewed and interpreted by myself shows sinus tachycardia 108 bpm with a narrow QRS, normal axis, normal intervals, no concerning ST changes.  RADIOLOGY  I have reviewed and interpreted chest x-ray  images.  No consolidation on my evaluation.   MEDICATIONS ORDERED IN ED: Medications  albuterol (VENTOLIN HFA) 108 (90 Base) MCG/ACT inhaler 2 puff (has no administration in time range)  ipratropium-albuterol (DUONEB) 0.5-2.5 (3) MG/3ML nebulizer solution 3 mL (3 mLs Nebulization Given 02/02/23 1416)     IMPRESSION / MDM / ASSESSMENT AND PLAN / ED COURSE  I reviewed the triage vital signs and the nursing notes.  Patient's presentation is most consistent with acute presentation with potential threat to life or bodily function.  Patient presents to the emergency department for cough and shortness of breath ongoing over the past 2 days.  Patient has a frequent dry cough on examination but clear lung sounds.  Symptoms seem very suggestive of acute bronchitis.  No chest pain no pleuritic pain.  No fever.  Reassuring appearing x-ray and negative COVID/flu/RSV. We will cover with antibiotics as well as a cough medication as a precaution.  Patient agreeable to plan of care.  Chest x-ray read as negative.  FINAL CLINICAL IMPRESSION(S) / ED DIAGNOSES   Acute bronchitis  Note:  This document was prepared using Dragon voice recognition software and may include unintentional dictation errors.   Minna Antis, MD 02/02/23 [phone]

## 2023-04-29 ENCOUNTER — Emergency Department
Admission: EM | Admit: 2023-04-29 | Discharge: 2023-04-29 | Disposition: A | Discharge status: Home / Self Care | Payer: Medicaid Other | Attending: Student in an Organized Health Care Education/Training Program | Admitting: Student in an Organized Health Care Education/Training Program

## 2023-04-29 ENCOUNTER — Other Ambulatory Visit: Payer: Self-pay

## 2023-04-29 DIAGNOSIS — Z20818 Contact with and (suspected) exposure to other bacterial communicable diseases: Secondary | ICD-10-CM | POA: Diagnosis present

## 2023-04-29 MED ORDER — AZITHROMYCIN 250 MG PO TABS: ORAL_TABLET | ORAL | 0 refills | Status: AC

## 2023-04-29 NOTE — ED Provider Notes (Signed)
   [facility] Provider Note    Event Date/Time   First MD Initiated Contact with Patient 04/29/23 1444     (approximate)   History   Cough   HPI  Ashley Cooper is a 33 y.o. female with history of diabetes presents for pain cough exposure.  Patient states that her child is at [facility] and tested positive for whooping cough.  Was told by the physicians there that she needed to be started on medication.  Tried to call her doctor but has been unsuccessful.  Also health department called him and told him they also needed to be tested.      Physical Exam   Triage Vital Signs: ED Triage Vitals  Encounter Vitals Group     BP      Systolic BP Percentile      Diastolic BP Percentile      Pulse      Resp      Temp      Temp src      SpO2      Weight      Height      Head Circumference      Peak Flow      Pain Score      Pain Loc      Pain Education      Exclude from Growth Chart     Most recent vital signs: There were no vitals filed for this visit.   General: Awake, no distress.   CV:  Good peripheral perfusion. regular rate and  rhythm Resp:  Normal effort. Lungs CTA Abd:  No distention.   Other:      ED Results / Procedures / Treatments   Labs (all labs ordered are listed, but only abnormal results are displayed) Labs Reviewed  BORDETELLA PERTUSSIS PCR     EKG     RADIOLOGY     PROCEDURES:   Procedures Chief Complaint  Patient presents with   Cough      MEDICATIONS ORDERED IN ED: Medications - No data to display   IMPRESSION / MDM / ASSESSMENT AND PLAN / ED COURSE  I reviewed the triage vital signs and the nursing notes.                              Differential diagnosis includes, but is not limited to, COVID, influenza, RSV, pertussis  Patient's presentation is most consistent with acute illness / injury with system symptoms.   Due to the positive exposure to pertussis will send pertussis test is a send  out.  Also will go ahead and treat the patient with Zithromax.  She is to follow-up with her regular doctor or the health department.  She is in agreement treatment plan.  Discharged stable condition.      FINAL CLINICAL IMPRESSION(S) / ED DIAGNOSES   Final diagnoses:  Pertussis exposure     Rx / DC Orders   ED Discharge Orders          Ordered    azithromycin (ZITHROMAX Z-PAK) 250 MG tablet        04/29/23 1450             Note:  This document was prepared using Dragon voice recognition software and may include unintentional dictation errors.    Faythe Ghee, PA-C 04/29/23 1450    Willy Eddy, MD 04/29/23 [phone]

## 2023-04-29 NOTE — ED Triage Notes (Signed)
Pt states oldest son tested positive for whooping cough on Friday. Pt started experiencing symptoms on Sunday.

## 2023-05-02 LAB — BORDETELLA PERTUSSIS PCR
B parapertussis, DNA: NEGATIVE
B pertussis, DNA: POSITIVE — AB

## 2023-05-03 ENCOUNTER — Telehealth: Payer: Self-pay | Admitting: Emergency Medicine
